# Patient Record
Sex: Female | Born: 1990 | Race: Black or African American | Hispanic: No | Marital: Married | Smoking: Current every day smoker
Health system: Southern US, Community
[De-identification: ages and names within clinical notes are randomized; demographics above are authoritative.]

## PROBLEM LIST (undated history)

## (undated) ENCOUNTER — Inpatient Hospital Stay (HOSPITAL_COMMUNITY): Payer: Self-pay

## (undated) DIAGNOSIS — M5430 Sciatica, unspecified side: Secondary | ICD-10-CM

## (undated) DIAGNOSIS — R87629 Unspecified abnormal cytological findings in specimens from vagina: Secondary | ICD-10-CM

## (undated) DIAGNOSIS — Z9889 Other specified postprocedural states: Secondary | ICD-10-CM

## (undated) HISTORY — PX: KNEE SURGERY: SHX244

---

## 2011-05-14 ENCOUNTER — Encounter (HOSPITAL_BASED_OUTPATIENT_CLINIC_OR_DEPARTMENT_OTHER): Payer: Self-pay | Admitting: *Deleted

## 2011-05-14 ENCOUNTER — Emergency Department (HOSPITAL_BASED_OUTPATIENT_CLINIC_OR_DEPARTMENT_OTHER)
Admission: EM | Admit: 2011-05-14 | Discharge: 2011-05-14 | Disposition: A | Discharge status: Home / Self Care | Payer: Self-pay | Attending: Emergency Medicine | Admitting: Emergency Medicine

## 2011-05-14 ENCOUNTER — Emergency Department (INDEPENDENT_AMBULATORY_CARE_PROVIDER_SITE_OTHER): Payer: Self-pay

## 2011-05-14 DIAGNOSIS — B9789 Other viral agents as the cause of diseases classified elsewhere: Secondary | ICD-10-CM | POA: Insufficient documentation

## 2011-05-14 DIAGNOSIS — R059 Cough, unspecified: Secondary | ICD-10-CM | POA: Insufficient documentation

## 2011-05-14 DIAGNOSIS — R0602 Shortness of breath: Secondary | ICD-10-CM | POA: Insufficient documentation

## 2011-05-14 DIAGNOSIS — R05 Cough: Secondary | ICD-10-CM | POA: Insufficient documentation

## 2011-05-14 DIAGNOSIS — B349 Viral infection, unspecified: Secondary | ICD-10-CM

## 2011-05-14 DIAGNOSIS — J45909 Unspecified asthma, uncomplicated: Secondary | ICD-10-CM | POA: Insufficient documentation

## 2011-05-14 DIAGNOSIS — R509 Fever, unspecified: Secondary | ICD-10-CM

## 2011-05-14 LAB — URINALYSIS, ROUTINE W REFLEX MICROSCOPIC
Ketones, ur: NEGATIVE mg/dL
Leukocytes, UA: NEGATIVE
Nitrite: NEGATIVE
Protein, ur: NEGATIVE mg/dL
Urobilinogen, UA: 1 mg/dL (ref 0.0–1.0)

## 2011-05-14 MED ORDER — ONDANSETRON HCL 4 MG PO TABS: 4.0000 mg | ORAL_TABLET | Freq: Four times a day (QID) | ORAL | Status: AC

## 2011-05-14 NOTE — ED Notes (Signed)
Pt states she has been vomiting today. Has no energy.

## 2011-05-14 NOTE — ED Notes (Signed)
Pt seen at [facility] yesterday for same, cough, nausea SOB, pt given presription for amoxil and zofran which she did not get filled

## 2011-05-14 NOTE — Discharge Instructions (Signed)
Viral Infections A viral infection can be caused by different types of viruses.Most viral infections are not serious and resolve on their own. However, some infections may cause severe symptoms and may lead to further complications. SYMPTOMS Viruses can frequently cause:  Minor sore throat.   Aches and pains.   Headaches.   Runny nose.   Different types of rashes.   Watery eyes.   Tiredness.   Cough.   Loss of appetite.   Gastrointestinal infections, resulting in nausea, vomiting, and diarrhea.  These symptoms do not respond to antibiotics because the infection is not caused by bacteria. However, you might catch a bacterial infection following the viral infection. This is sometimes called a "superinfection." Symptoms of such a bacterial infection may include:  Worsening sore throat with pus and difficulty swallowing.   Swollen neck glands.   Chills and a high or persistent fever.   Severe headache.   Tenderness over the sinuses.   Persistent overall ill feeling (malaise), muscle aches, and tiredness (fatigue).   Persistent cough.   Yellow, green, or brown mucus production with coughing.  HOME CARE INSTRUCTIONS   Only take over-the-counter or prescription medicines for pain, discomfort, diarrhea, or fever as directed by your caregiver.   Drink enough water and fluids to keep your urine clear or pale yellow. Sports drinks can provide valuable electrolytes, sugars, and hydration.   Get plenty of rest and maintain proper nutrition. Soups and broths with crackers or rice are fine.  SEEK IMMEDIATE MEDICAL CARE IF:   You have severe headaches, shortness of breath, chest pain, neck pain, or an unusual rash.   You have uncontrolled vomiting, diarrhea, or you are unable to keep down fluids.   You or your child has an oral temperature above 102 F (38.9 C), not controlled by medicine.   Your baby is older than 3 months with a rectal temperature of 102 F (38.9 C) or  higher.   Your baby is 3 months old or younger with a rectal temperature of 100.4 F (38 C) or higher.  MAKE SURE YOU:   Understand these instructions.   Will watch your condition.   Will get help right away if you are not doing well or get worse.  Document Released: 12/18/2004 Document Revised: 11/20/2010 Document Reviewed: 07/15/2010 ExitCare Patient Information 2012 [employer]. 

## 2011-05-14 NOTE — ED Notes (Signed)
Pt states can not afford meds

## 2011-05-14 NOTE — ED Provider Notes (Signed)
History     CSN: [id number]  Arrival date & time 05/14/11  4098   First MD Initiated Contact with Patient 05/14/11 1934      Chief Complaint  Patient presents with  . Shortness of Breath  . Cough    (Consider location/radiation/quality/duration/timing/severity/associated sxs/prior treatment) HPI Comments: Patient presents with 3 days of cough, congestion, nausea, shortness of breath, fever and vomiting. She seen at [facility] ER yesterday for the same and given Zithromax which she did not fill.  She states fevers to 102 with productive cough of yellow mucus. She's had frequent episodes of vomiting today. Denies any diarrhea, back pain, chest pain. No sick contacts. Did receive a flu shot.  Last menstrual period in January.  The history is provided by the patient.    Past Medical History  Diagnosis Date  . Asthma     History reviewed. No pertinent past surgical history.  History reviewed. No pertinent family history.  History  Substance Use Topics  . Smoking status: Never Smoker   . Smokeless tobacco: Not on file  . Alcohol Use: No    OB History    Grav Para Term Preterm Abortions TAB SAB Ect Mult Living                  Review of Systems  Constitutional: Negative for fever, activity change and appetite change.  HENT: Negative for congestion and rhinorrhea.   Eyes: Negative for photophobia.  Respiratory: Positive for cough and shortness of breath.   Cardiovascular: Negative for chest pain.  Gastrointestinal: Positive for nausea, vomiting and abdominal pain. Negative for diarrhea.  Genitourinary: Negative for dysuria, hematuria, vaginal bleeding and vaginal discharge.  Musculoskeletal: Positive for myalgias and arthralgias. Negative for back pain.  Neurological: Negative for weakness and headaches.    Allergies  Sulfa antibiotics  Home Medications   Current Outpatient Rx  Name Route Sig Dispense Refill  . ALBUTEROL SULFATE HFA 108 (90 BASE) MCG/ACT IN AERS  Inhalation Inhale into the lungs every 6 (six) hours as needed.    . ALBUTEROL SULFATE (2.5 MG/3ML) 0.083% IN NEBU Nebulization Take 2.5 mg by nebulization every 6 (six) hours as needed.    Marland Kitchen EXCEDRIN MIGRAINE PO Oral Take 2 tablets by mouth daily as needed. Patient used this medication for a migraine headache.    [place] IN Inhalation Inhale 2 puffs into the lungs daily as needed.    Marland Kitchen ONDANSETRON HCL 4 MG PO TABS Oral Take 1 tablet (4 mg total) by mouth every 6 (six) hours. 12 tablet 0    BP 130/80  Pulse 98  Temp(Src) 98.4 F (36.9 C) (Oral)  Resp 16  Ht 5\' 6"  (1.676 m)  Wt 187 lb (84.823 kg)  BMI 30.18 kg/m2  SpO2 100%  LMP 04/07/2011  Physical Exam  Constitutional: She is oriented to person, place, and time. She appears well-developed and well-nourished. No distress.       Playing on laptop computer  HENT:  Head: Normocephalic and atraumatic.  Right Ear: External ear normal.  Left Ear: External ear normal.  Mouth/Throat: Oropharynx is clear and moist. No oropharyngeal exudate.       No sinus tenderness  Eyes: Conjunctivae are normal. Pupils are equal, round, and reactive to light.  Neck: Normal range of motion.       No meningismus  Cardiovascular: Normal rate, regular rhythm and normal heart sounds.   Pulmonary/Chest: Effort normal and breath sounds normal.  Abdominal: Soft. There is no  tenderness. There is no rebound and no guarding.  Musculoskeletal: Normal range of motion. She exhibits no edema and no tenderness.  Neurological: She is alert and oriented to person, place, and time. No cranial nerve deficit.  Skin: Skin is warm.    ED Course  Procedures (including critical care time)   Labs Reviewed  URINALYSIS, ROUTINE W REFLEX MICROSCOPIC  PREGNANCY, URINE   Dg Chest 2 View  05/14/2011  *RADIOLOGY REPORT*  Clinical Data: Productive cough and fever  CHEST - 2 VIEW  Comparison: None.  Findings: Heart size is normal.  Mediastinal shadows are normal. Lungs are  clear.  No effusions.  No bony abnormalities.  IMPRESSION: Normal chest  Original Report Authenticated By: Thomasenia Sales, M.D.     1. Viral syndrome       MDM  Three days of a fever, nausea, vomiting, cough, congestion. No distress on exam, vitals stable.        Glynn Octave, MD 05/14/11 [phone]

## 2013-03-28 LAB — OB RESULTS CONSOLE GC/CHLAMYDIA
Chlamydia: NEGATIVE
Gonorrhea: NEGATIVE

## 2013-03-28 LAB — OB RESULTS CONSOLE HIV ANTIBODY (ROUTINE TESTING): HIV: NONREACTIVE

## 2013-03-28 LAB — OB RESULTS CONSOLE RUBELLA ANTIBODY, IGM: Rubella: IMMUNE

## 2013-03-28 LAB — OB RESULTS CONSOLE HGB/HCT, BLOOD
HCT: 32 %
HEMOGLOBIN: 11.3 g/dL

## 2013-03-28 LAB — OB RESULTS CONSOLE RPR: RPR: NONREACTIVE

## 2013-03-28 LAB — OB RESULTS CONSOLE PLATELET COUNT: PLATELETS: 271 10*3/uL

## 2013-03-28 LAB — OB RESULTS CONSOLE HEPATITIS B SURFACE ANTIGEN: Hepatitis B Surface Ag: NEGATIVE

## 2013-04-05 ENCOUNTER — Emergency Department (HOSPITAL_COMMUNITY)
Admission: EM | Admit: 2013-04-05 | Discharge: 2013-04-05 | Disposition: A | Discharge status: Home / Self Care | Payer: Medicaid Other | Attending: Emergency Medicine | Admitting: Emergency Medicine

## 2013-04-05 ENCOUNTER — Encounter (HOSPITAL_COMMUNITY): Payer: Self-pay | Admitting: Emergency Medicine

## 2013-04-05 DIAGNOSIS — J45909 Unspecified asthma, uncomplicated: Secondary | ICD-10-CM | POA: Insufficient documentation

## 2013-04-05 DIAGNOSIS — M5431 Sciatica, right side: Secondary | ICD-10-CM

## 2013-04-05 DIAGNOSIS — Z79899 Other long term (current) drug therapy: Secondary | ICD-10-CM | POA: Insufficient documentation

## 2013-04-05 DIAGNOSIS — M5430 Sciatica, unspecified side: Secondary | ICD-10-CM | POA: Insufficient documentation

## 2013-04-05 DIAGNOSIS — O9989 Other specified diseases and conditions complicating pregnancy, childbirth and the puerperium: Secondary | ICD-10-CM | POA: Insufficient documentation

## 2013-04-05 DIAGNOSIS — IMO0002 Reserved for concepts with insufficient information to code with codable children: Secondary | ICD-10-CM | POA: Insufficient documentation

## 2013-04-05 MED ORDER — HYDROCODONE-ACETAMINOPHEN 5-325 MG PO TABS
1.0000 | ORAL_TABLET | Freq: Four times a day (QID) | ORAL | Status: DC | PRN
Start: 2013-04-05 — End: 2013-06-27

## 2013-04-05 MED ORDER — HYDROCODONE-ACETAMINOPHEN 5-325 MG PO TABS
1.0000 | ORAL_TABLET | Freq: Once | ORAL | Status: AC
  Administered 2013-04-05: 1 via ORAL
  Filled 2013-04-05: qty 1

## 2013-04-05 NOTE — ED Notes (Addendum)
Per EMS: pt coming from home with c/o sever right leg pain. Pt reports waking up around midnight with pain starting at her right toe radiating up her back. Pt was ambulatory with assistance on scene. Pt has good PMS. Pt is [redacted] weeks pregnant. Pt denies injury/traum to leg. Pt is A&Ox, respirations equal and unlabored, skin warm and dry.

## 2013-04-05 NOTE — ED Provider Notes (Signed)
CSN: [id number]     Arrival date & time 04/05/13  0122 History   First MD Initiated Contact with Patient 04/05/13 0230     Chief Complaint  Patient presents with  . Leg Pain   (Consider location/radiation/quality/duration/timing/severity/associated sxs/prior Treatment) HPI This is a [age] year old female who is [redacted] weeks pregnant. She is here after awakening this morning with right lower back pain. The pain is located at about the right SI joint and radiates down the lateral aspect of the right leg then moving to the back of the leg past the knee. She denies any injury. She denies any numbness or weakness. She is not able to bear weight on that leg due to pain. She has had no acute bowel or bladder changes. [place] [facility].  Past Medical History  Diagnosis Date  . Asthma    History reviewed. No pertinent past surgical history. History reviewed. No pertinent family history. History  Substance Use Topics  . Smoking status: Never Smoker   . Smokeless tobacco: Not on file  . Alcohol Use: No   OB History   Grav Para Term Preterm Abortions TAB SAB Ect Mult Living   5 1             Review of Systems  All other systems reviewed and are negative.    Allergies  Sulfa antibiotics  Home Medications   Current Outpatient Rx  Name  Route  Sig  Dispense  Refill  . albuterol (PROVENTIL HFA;VENTOLIN HFA) 108 (90 BASE) MCG/ACT inhaler   Inhalation   Inhale into the lungs every 6 (six) hours as needed.         Marland Kitchen. albuterol (PROVENTIL) (2.5 MG/3ML) 0.083% nebulizer solution   Nebulization   Take 2.5 mg by nebulization every 6 (six) hours as needed.         . Aspirin-Acetaminophen-Caffeine (EXCEDRIN MIGRAINE PO)   Oral   Take 2 tablets by mouth daily as needed. Patient used this medication for a migraine headache.         . Budesonide-Formoterol Fumarate (SYMBICORT IN)   Inhalation   Inhale 2 puffs into the lungs daily as needed.          BP 110/78  Temp(Src)  98.1 F (36.7 C) (Oral)  Resp 18  SpO2 100%  Physical Exam General: Well-developed, well-nourished female in no acute distress; appearance consistent with age of record HENT: normocephalic; atraumatic Eyes: pupils equal, round and reactive to light; extraocular muscles intact Neck: supple Heart: regular rate and rhythm Lungs: clear to auscultation bilaterally Abdomen: soft; gravid; nontender; bowel sounds present Back: Right paraspinal tenderness; pain on right straight leg raise at about 30 Extremities: No deformity; decreased range of motion right hip due to pain; pulses normal Neurologic: Awake, alert and oriented; motor function intact in all extremities and symmetric; no facial droop; sensation intact and symmetric in lower extremities Skin: Warm and dry Psychiatric: Normal mood and affect    ED Course  Procedures (including critical care time)   MDM  Patient's exam is consistent with sciatica. She was advised of the desire to minimize medications in pregnancy will write for a short course of analgesics and have her followup with her obstetrician for longer term care.    Hanley SeamenJohn L Cathyrn Deas, MD 04/05/13 [phone]

## 2013-04-18 ENCOUNTER — Encounter (HOSPITAL_COMMUNITY): Payer: Self-pay | Admitting: *Deleted

## 2013-04-18 ENCOUNTER — Inpatient Hospital Stay (HOSPITAL_COMMUNITY)
Admission: AD | Admit: 2013-04-18 | Discharge: 2013-04-18 | Disposition: A | Discharge status: Home / Self Care | Payer: Medicaid Other | Source: Ambulatory Visit | Attending: Obstetrics and Gynecology | Admitting: Obstetrics and Gynecology

## 2013-04-18 DIAGNOSIS — O212 Late vomiting of pregnancy: Secondary | ICD-10-CM | POA: Insufficient documentation

## 2013-04-18 DIAGNOSIS — O219 Vomiting of pregnancy, unspecified: Secondary | ICD-10-CM

## 2013-04-18 DIAGNOSIS — O99891 Other specified diseases and conditions complicating pregnancy: Secondary | ICD-10-CM | POA: Insufficient documentation

## 2013-04-18 DIAGNOSIS — O99612 Diseases of the digestive system complicating pregnancy, second trimester: Secondary | ICD-10-CM

## 2013-04-18 DIAGNOSIS — K5900 Constipation, unspecified: Secondary | ICD-10-CM | POA: Insufficient documentation

## 2013-04-18 DIAGNOSIS — K219 Gastro-esophageal reflux disease without esophagitis: Secondary | ICD-10-CM | POA: Insufficient documentation

## 2013-04-18 DIAGNOSIS — O99330 Smoking (tobacco) complicating pregnancy, unspecified trimester: Secondary | ICD-10-CM | POA: Insufficient documentation

## 2013-04-18 DIAGNOSIS — O9989 Other specified diseases and conditions complicating pregnancy, childbirth and the puerperium: Secondary | ICD-10-CM

## 2013-04-18 HISTORY — DX: Sciatica, unspecified side: M54.30

## 2013-04-18 HISTORY — DX: Unspecified abnormal cytological findings in specimens from vagina: R87.629

## 2013-04-18 LAB — URINALYSIS, ROUTINE W REFLEX MICROSCOPIC
GLUCOSE, UA: NEGATIVE mg/dL
Hgb urine dipstick: NEGATIVE
Ketones, ur: 15 mg/dL — AB
LEUKOCYTES UA: NEGATIVE
NITRITE: NEGATIVE
PH: 8.5 — AB (ref 5.0–8.0)
Protein, ur: NEGATIVE mg/dL
Specific Gravity, Urine: 1.015 (ref 1.005–1.030)
Urobilinogen, UA: >8 mg/dL — ABNORMAL HIGH (ref 0.0–1.0)

## 2013-04-18 MED ORDER — POLYETHYLENE GLYCOL 3350 17 GM/SCOOP PO POWD: 17.0000 g | Freq: Two times a day (BID) | ORAL | Status: DC

## 2013-04-18 MED ORDER — PROMETHAZINE HCL 12.5 MG PO TABS: 12.5000 mg | ORAL_TABLET | Freq: Four times a day (QID) | ORAL | Status: DC | PRN

## 2013-04-18 MED ORDER — PROMETHAZINE HCL 25 MG PO TABS
25.0000 mg | ORAL_TABLET | Freq: Four times a day (QID) | ORAL | Status: DC | PRN
  Administered 2013-04-18: 25 mg via ORAL
  Filled 2013-04-18: qty 1

## 2013-04-18 NOTE — MAU Provider Note (Signed)
First Provider Initiated Contact with Patient 04/18/13 0222      Chief Complaint:  Nausea and Emesis During Pregnancy   Vicki Everett is  23 y.o. [id number] at [redacted]w[redacted]d presents complaining of Nausea and Emesis During Pregnancy Reports nausea and emesis starting yesterday at 3pm. Unable to keep anything down at home. Pt took zofran 4mg  x1 without resolution. No fevers/chills, no urinary symptoms, reports no BM in 2 weeks. No cough, sob, +HA, no CP, +Acid reflux. No sick contacts.   +FM, no LOF, no VB, no Ctx  Obstetrical/Gynecological History: OB History   Grav Para Term Preterm Abortions TAB SAB Ect Mult Living   5 1  1 3  3   1      Past Medical History: Past Medical History  Diagnosis Date  . Asthma   . Vaginal Pap smear, abnormal   . Sciatica     Past Surgical History: Past Surgical History  Procedure Laterality Date  . Knee surgery  2011 & 2012    Family History: History reviewed. No pertinent family history.  Social History: History  Substance Use Topics  . Smoking status: Current Every Day Smoker -- 0.25 packs/day    Types: Cigarettes  . Smokeless tobacco: Not on file  . Alcohol Use: No    Allergies:  Allergies  Allergen Reactions  . Sulfa Antibiotics Anaphylaxis and Itching    Meds:  Prescriptions prior to admission  Medication Sig Dispense Refill  . HYDROcodone-acetaminophen (NORCO/VICODIN) 5-325 MG per tablet Take 1-2 tablets by mouth every 6 (six) hours as needed (for pain).  20 tablet  0  . metroNIDAZOLE (METROGEL) 0.75 % vaginal gel Place 1 Applicatorful vaginally 2 (two) times daily.      . ondansetron (ZOFRAN-ODT) 4 MG disintegrating tablet Take 4 mg by mouth every 8 (eight) hours as needed for nausea or vomiting.      Marland Kitchen albuterol (PROVENTIL HFA;VENTOLIN HFA) 108 (90 BASE) MCG/ACT inhaler Inhale into the lungs every 6 (six) hours as needed.      Marland Kitchen albuterol (PROVENTIL) (2.5 MG/3ML) 0.083% nebulizer solution Take 2.5 mg by nebulization every 6 (six)  hours as needed.      . Aspirin-Acetaminophen-Caffeine (EXCEDRIN MIGRAINE PO) Take 2 tablets by mouth daily as needed. Patient used this medication for a migraine headache.      . Budesonide-Formoterol Fumarate (SYMBICORT IN) Inhale 2 puffs into the lungs daily as needed.        Physical Exam  Blood pressure 86/49, pulse 97, temperature 98.3 F (36.8 C), resp. rate 16, height 5\' 6"  (1.676 m), weight 77.565 kg (171 lb), SpO2 98.00%. GENERAL: Well-developed, well-nourished female in no acute distress.  LUNGS: Clear to auscultation bilaterally.  HEART: Regular rate and rhythm. ABDOMEN: Soft, nontender, nondistended, gravid.  EXTREMITIES: Nontender, no edema, 2+ distal pulses. DTR's 2+  Dilation: Closed Effacement (%): Thick Station: -3 Exam by:: Genea Rheaume MD  FHT:  Baseline rate 140s bpm   Variability moderate  Accelerations present 10x10  Decelerations variable once with good variability Contractions: none   Labs: Results for orders placed during the hospital encounter of 04/18/13 (from the past 24 hour(s))  URINALYSIS, ROUTINE W REFLEX MICROSCOPIC   Collection Time    04/18/13  1:31 AM      Result Value Range   Color, Urine YELLOW  YELLOW   APPearance CLEAR  CLEAR   Specific Gravity, Urine 1.015  1.005 - 1.030   pH 8.5 (*) 5.0 - 8.0   Glucose, UA NEGATIVE  NEGATIVE mg/dL  Hgb urine dipstick NEGATIVE  NEGATIVE   Bilirubin Urine SMALL (*) NEGATIVE   Ketones, ur 15 (*) NEGATIVE mg/dL   Protein, ur NEGATIVE  NEGATIVE mg/dL   Urobilinogen, UA >1.6>8.0 (*) 0.0 - 1.0 mg/dL   Nitrite NEGATIVE  NEGATIVE   Leukocytes, UA NEGATIVE  NEGATIVE   Imaging Studies:  No results found.  Assessment: Vicki Everett is  23 y.o. [id number] at 2368w2d presents with Nausea and vomiting. Next appt feb 3rd. Will give pt does of phenergan, reassuring fluid status based on exam, vitals and specific gravity. Improved nausea. Will discharge home with Rx for phenergan.  Constipation: likely related to PNV, zofran  use. Will start on miralax.  Vicki Everett 1/26/20152:22 AM

## 2013-04-18 NOTE — MAU Note (Signed)
Had nausea and vomiting for the last 9-10 hours. Took Zofran with no relief.

## 2013-04-18 NOTE — Discharge Instructions (Signed)
Nausea and Vomiting  Nausea is a sick feeling that often comes before throwing up (vomiting). Vomiting is a reflex where stomach contents come out of your mouth. Vomiting can cause severe loss of body fluids (dehydration). Children and elderly adults can become dehydrated quickly, especially if they also have diarrhea. Nausea and vomiting are symptoms of a condition or disease. It is important to find the cause of your symptoms.  CAUSES    Direct irritation of the stomach lining. This irritation can result from increased acid production (gastroesophageal reflux disease), infection, food poisoning, taking certain medicines (such as nonsteroidal anti-inflammatory drugs), alcohol use, or tobacco use.   Signals from the brain.These signals could be caused by a headache, heat exposure, an inner ear disturbance, increased pressure in the brain from injury, infection, a tumor, or a concussion, pain, emotional stimulus, or metabolic problems.   An obstruction in the gastrointestinal tract (bowel obstruction).   Illnesses such as diabetes, hepatitis, gallbladder problems, appendicitis, kidney problems, cancer, sepsis, atypical symptoms of a heart attack, or eating disorders.   Medical treatments such as chemotherapy and radiation.   Receiving medicine that makes you sleep (general anesthetic) during surgery.  DIAGNOSIS  Your caregiver may ask for tests to be done if the problems do not improve after a few days. Tests may also be done if symptoms are severe or if the reason for the nausea and vomiting is not clear. Tests may include:   Urine tests.   Blood tests.   Stool tests.   Cultures (to look for evidence of infection).   X-rays or other imaging studies.  Test results can help your caregiver make decisions about treatment or the need for additional tests.  TREATMENT  You need to stay well hydrated. Drink frequently but in small amounts.You may wish to drink water, sports drinks, clear broth, or eat frozen  ice pops or gelatin dessert to help stay hydrated.When you eat, eating slowly may help prevent nausea.There are also some antinausea medicines that may help prevent nausea.  HOME CARE INSTRUCTIONS    Take all medicine as directed by your caregiver.   If you do not have an appetite, do not force yourself to eat. However, you must continue to drink fluids.   If you have an appetite, eat a normal diet unless your caregiver tells you differently.   Eat a variety of complex carbohydrates (rice, wheat, potatoes, bread), lean meats, yogurt, fruits, and vegetables.   Avoid high-fat foods because they are more difficult to digest.   Drink enough water and fluids to keep your urine clear or pale yellow.   If you are dehydrated, ask your caregiver for specific rehydration instructions. Signs of dehydration may include:   Severe thirst.   Dry lips and mouth.   Dizziness.   Dark urine.   Decreasing urine frequency and amount.   Confusion.   Rapid breathing or pulse.  SEEK IMMEDIATE MEDICAL CARE IF:    You have blood or brown flecks (like coffee grounds) in your vomit.   You have black or bloody stools.   You have a severe headache or stiff neck.   You are confused.   You have severe abdominal pain.   You have chest pain or trouble breathing.   You do not urinate at least once every 8 hours.   You develop cold or clammy skin.   You continue to vomit for longer than 24 to 48 hours.   You have a fever.  MAKE SURE YOU:      Understand these instructions.   Will watch your condition.   Will get help right away if you are not doing well or get worse.  Document Released: 03/10/2005 Document Revised: 06/02/2011 Document Reviewed: 08/07/2010  [employer] Patient Information 2014 [employer].          Constipation, Adult  Constipation is when a person has fewer than 3 bowel movements a week; has difficulty having a bowel movement; or has stools that are dry, hard, or larger than normal. As people grow older,  constipation is more common. If you try to fix constipation with medicines that make you have a bowel movement (laxatives), the problem may get worse. Long-term laxative use may cause the muscles of the colon to become weak. A low-fiber diet, not taking in enough fluids, and taking certain medicines may make constipation worse.  CAUSES    Certain medicines, such as antidepressants, pain medicine, iron supplements, antacids, and water pills.    Certain diseases, such as diabetes, irritable bowel syndrome (IBS), thyroid disease, or depression.    Not drinking enough water.    Not eating enough fiber-rich foods.    Stress or travel.   Lack of physical activity or exercise.   Not going to the restroom when there is the urge to have a bowel movement.   Ignoring the urge to have a bowel movement.   Using laxatives too much.  SYMPTOMS    Having fewer than 3 bowel movements a week.    Straining to have a bowel movement.    Having hard, dry, or larger than normal stools.    Feeling full or bloated.    Pain in the lower abdomen.   Not feeling relief after having a bowel movement.  DIAGNOSIS   Your caregiver will take a medical history and perform a physical exam. Further testing may be done for severe constipation. Some tests may include:    A barium enema X-ray to examine your rectum, colon, and sometimes, your small intestine.   A sigmoidoscopy to examine your lower colon.   A colonoscopy to examine your entire colon.  TREATMENT   Treatment will depend on the severity of your constipation and what is causing it. Some dietary treatments include drinking more fluids and eating more fiber-rich foods. Lifestyle treatments may include regular exercise. If these diet and lifestyle recommendations do not help, your caregiver may recommend taking over-the-counter laxative medicines to help you have bowel movements. Prescription medicines may be prescribed if over-the-counter medicines do not work.   HOME  CARE INSTRUCTIONS    Increase dietary fiber in your diet, such as fruits, vegetables, whole grains, and beans. Limit high-fat and processed sugars in your diet, such as French fries, hamburgers, cookies, candies, and soda.    A fiber supplement may be added to your diet if you cannot get enough fiber from foods.    Drink enough fluids to keep your urine clear or pale yellow.    Exercise regularly or as directed by your caregiver.    Go to the restroom when you have the urge to go. Do not hold it.   Only take medicines as directed by your caregiver. Do not take other medicines for constipation without talking to your caregiver first.  SEEK IMMEDIATE MEDICAL CARE IF:    You have bright red blood in your stool.    Your constipation lasts for more than 4 days or gets worse.    You have abdominal or rectal pain.    You   have thin, pencil-like stools.   You have unexplained weight loss.  MAKE SURE YOU:    Understand these instructions.   Will watch your condition.   Will get help right away if you are not doing well or get worse.  Document Released: 12/07/2003 Document Revised: 06/02/2011 Document Reviewed: 12/20/2012  [employer] Patient Information 2014 [employer].

## 2013-04-20 NOTE — MAU Provider Note (Signed)
`````  Attestation of Attending Supervision of Advanced Practitioner: Evaluation and management procedures were performed by the PA/NP/CNM/OB Fellow under my supervision/collaboration. Chart reviewed and agree with management and plan.  Korah Hufstedler V 04/20/2013 5:33 PM     

## 2013-04-23 ENCOUNTER — Encounter (HOSPITAL_COMMUNITY): Payer: Self-pay | Admitting: *Deleted

## 2013-04-23 ENCOUNTER — Inpatient Hospital Stay (HOSPITAL_COMMUNITY)
Admission: AD | Admit: 2013-04-23 | Discharge: 2013-04-23 | Disposition: A | Discharge status: Home / Self Care | Payer: Medicaid Other | Attending: Obstetrics and Gynecology | Admitting: Obstetrics and Gynecology

## 2013-04-23 ENCOUNTER — Inpatient Hospital Stay (HOSPITAL_COMMUNITY): Payer: Medicaid Other

## 2013-04-23 DIAGNOSIS — W010XXA Fall on same level from slipping, tripping and stumbling without subsequent striking against object, initial encounter: Secondary | ICD-10-CM | POA: Insufficient documentation

## 2013-04-23 DIAGNOSIS — Z3480 Encounter for supervision of other normal pregnancy, unspecified trimester: Secondary | ICD-10-CM

## 2013-04-23 DIAGNOSIS — O9989 Other specified diseases and conditions complicating pregnancy, childbirth and the puerperium: Principal | ICD-10-CM

## 2013-04-23 DIAGNOSIS — Y92009 Unspecified place in unspecified non-institutional (private) residence as the place of occurrence of the external cause: Secondary | ICD-10-CM | POA: Insufficient documentation

## 2013-04-23 DIAGNOSIS — W19XXXA Unspecified fall, initial encounter: Secondary | ICD-10-CM

## 2013-04-23 DIAGNOSIS — O99891 Other specified diseases and conditions complicating pregnancy: Secondary | ICD-10-CM | POA: Insufficient documentation

## 2013-04-23 DIAGNOSIS — Z87891 Personal history of nicotine dependence: Secondary | ICD-10-CM | POA: Insufficient documentation

## 2013-04-23 LAB — URINALYSIS, ROUTINE W REFLEX MICROSCOPIC
BILIRUBIN URINE: NEGATIVE
Glucose, UA: NEGATIVE mg/dL
Hgb urine dipstick: NEGATIVE
KETONES UR: NEGATIVE mg/dL
LEUKOCYTES UA: NEGATIVE
NITRITE: NEGATIVE
PH: 6 (ref 5.0–8.0)
Protein, ur: NEGATIVE mg/dL
SPECIFIC GRAVITY, URINE: 1.025 (ref 1.005–1.030)
UROBILINOGEN UA: 1 mg/dL (ref 0.0–1.0)

## 2013-04-23 LAB — RAPID URINE DRUG SCREEN, HOSP PERFORMED
Amphetamines: NOT DETECTED
BARBITURATES: NOT DETECTED
BENZODIAZEPINES: NOT DETECTED
COCAINE: NOT DETECTED
Opiates: POSITIVE — AB
Tetrahydrocannabinol: NOT DETECTED

## 2013-04-23 NOTE — MAU Note (Signed)
Pt up to bathroom FHR down to 100 bpm while getting up to go to the bathroom, so pt placed back on EFM for monitoring

## 2013-04-23 NOTE — MAU Note (Signed)
Pt presents with complaints of falling over a rug this morning around 2am but doesn't remember how she landed. States she hasn't felt the baby move since she fell.

## 2013-04-23 NOTE — MAU Provider Note (Signed)
Attestation of Attending Supervision of Advanced Practitioner (CNM/NP): Evaluation and management procedures were performed by the Advanced Practitioner under my supervision and collaboration.  I have reviewed the Advanced Practitioner's note and chart, and I agree with the management and plan.  Kyndal Heringer 04/23/2013 6:57 PM   

## 2013-04-23 NOTE — MAU Provider Note (Signed)
History     CSN: [id number]  Arrival date and time: 04/23/13 0957   None     Chief Complaint  Patient presents with  . Fall   HPI Malachi ParadiseCarmella Noland FordyceLentz is a 23 y.o. [id number] female @ 345w0d who presents w/ report of tripping over welcome mat on porch @ 0200 this am as she returned from church. States they are having a week-long program and sometimes it runs late at night. Reports falling on knees and onto Lt side. Doesn't think abdomen hit porch, but can't remember. Reported to nurses on arrival that she hadn't felt baby move since, however she reported to me that the baby has been moving like she normally does the entire time since she fell. Denies uc's, vb, lof, illicit drug or etoh use. Ranelle Oystereceives pnc at [facility], Dr. Janae SauceLouk.  Denies any complications during this pregnancy. Next appt Tues. Just recently moved to [place], [facility], hasn't found provider in [place] yet.   OB History   Grav Para Term Preterm Abortions TAB SAB Ect Mult Living   5 1  1 3  3   1       Past Medical History  Diagnosis Date  . Asthma   . Vaginal Pap smear, abnormal   . Sciatica     Past Surgical History  Procedure Laterality Date  . Knee surgery  2011 & 2012    History reviewed. No pertinent family history.  History  Substance Use Topics  . Smoking status: Former Smoker -- 0.25 packs/day  . Smokeless tobacco: Not on file  . Alcohol Use: No    Allergies:  Allergies  Allergen Reactions  . Sulfa Antibiotics Anaphylaxis and Itching    Prescriptions prior to admission  Medication Sig Dispense Refill  . acetaminophen (TYLENOL) 500 MG tablet Take 500 mg by mouth daily as needed.      Marland Kitchen. HYDROcodone-acetaminophen (NORCO/VICODIN) 5-325 MG per tablet Take 1-2 tablets by mouth every 6 (six) hours as needed (for pain).  20 tablet  0  . ondansetron (ZOFRAN-ODT) 4 MG disintegrating tablet Take 4 mg by mouth every 8 (eight) hours as needed for nausea or vomiting.      . promethazine (PHENERGAN)  12.5 MG tablet Take 1 tablet (12.5 mg total) by mouth every 6 (six) hours as needed for nausea or vomiting.  15 tablet  0  . albuterol (PROVENTIL HFA;VENTOLIN HFA) 108 (90 BASE) MCG/ACT inhaler Inhale into the lungs every 6 (six) hours as needed.      Marland Kitchen. albuterol (PROVENTIL) (2.5 MG/3ML) 0.083% nebulizer solution Take 2.5 mg by nebulization every 6 (six) hours as needed.      . polyethylene glycol powder (MIRALAX) powder Take 17 g by mouth 2 (two) times daily.  255 g  1  . [DISCONTINUED] metroNIDAZOLE (METROGEL) 0.75 % vaginal gel Place 1 Applicatorful vaginally 2 (two) times daily.        ROS Pertinent +/- as listed in HPI Physical Exam   Blood pressure 125/69, pulse 107, temperature 97.5 F (36.4 C), resp. rate 16.  Physical Exam  Constitutional: She is oriented to person, place, and time. She appears well-developed and well-nourished.  HENT:  Head: Normocephalic.  Cardiovascular: Normal rate.   Respiratory: Effort normal.  GI: Soft. There is no tenderness.  gravid  Neurological: She is alert and oriented to person, place, and time.  Skin: Skin is warm and dry.  Psychiatric: She has a normal mood and affect. Her behavior is normal. Judgment  and thought content normal.   FHR: 140, mod variability, +accels, no decels UCs: none MAU Course  Procedures  EFM x 4hrs, pt sleeping majority of time Exam UDS, pos opiates- pt has rx for hydrocodone Request records from [facility]  Results for orders placed during the hospital encounter of 04/23/13 (from the past 24 hour(s))  URINALYSIS, ROUTINE W REFLEX MICROSCOPIC     Status: None   Collection Time    04/23/13 10:20 AM      Result Value Range   Color, Urine YELLOW  YELLOW   APPearance CLEAR  CLEAR   Specific Gravity, Urine 1.025  1.005 - 1.030   pH 6.0  5.0 - 8.0   Glucose, UA NEGATIVE  NEGATIVE mg/dL   Hgb urine dipstick NEGATIVE  NEGATIVE   Bilirubin Urine NEGATIVE  NEGATIVE   Ketones, ur NEGATIVE  NEGATIVE mg/dL   Protein, ur  NEGATIVE  NEGATIVE mg/dL   Urobilinogen, UA 1.0  0.0 - 1.0 mg/dL   Nitrite NEGATIVE  NEGATIVE   Leukocytes, UA NEGATIVE  NEGATIVE  URINE RAPID DRUG SCREEN (HOSP PERFORMED)     Status: Abnormal   Collection Time    04/23/13 10:20 AM      Result Value Range   Opiates POSITIVE (*) NONE DETECTED   Cocaine NONE DETECTED  NONE DETECTED   Benzodiazepines NONE DETECTED  NONE DETECTED   Amphetamines NONE DETECTED  NONE DETECTED   Tetrahydrocannabinol NONE DETECTED  NONE DETECTED   Barbiturates NONE DETECTED  NONE DETECTED    Assessment and Plan  A:   [redacted]w[redacted]d SIUP  G5P0131   S/p fall  Cat I FHR, reassuring for GA x 4hrs  No uterine activity x 4hrs P:  D/C home  Printed info on s/s abruption, reasons to return to care immediately given  To keep appt w/ Dr. Janae Sauce in [place] on Tues  List of [place] [facility] providers given, as pt recently moved to [place], plans on delivering at [facility], just hasn't found [place] provider yet  To make appt w/ [place] provider of her choice asap   Marge Duncans 04/23/2013, 10:32 AM    1357: As being taken off of efm, fhr down to 100s from 140, so asked RN to place pt back on to confirm reassuring fhr 1422: Notified by RN, pt now contracting- pt perceives as slight crampiness 1430: discussed w/ Dr. Jolayne Panther, push po hydration, will get u/s to assess for abruption. If u/s normal and uc's resolve w/ po hydration, OK to continue w/ d/c home  1530: Returned from u/s, uc's had resolved prior to going to u/s.  U/s report: frank breech, fhr 156, placenta anterior above os, no definite evidence of acute placental abruption, AFI subj wnl, largest pocket 5.6cm, CL=3.9cm SVE: LTC  Pt to be d/c'd home, thoroughly reviewed reasons to return  Cheral Marker, CNM, [facility] 04/23/2013 3:32 PM

## 2013-04-23 NOTE — Discharge Instructions (Signed)
Prenatal Care [facility]    [facility]  [phone]     Phone: [phone]          [facility]                      [facility]  @[place]     Phone: [phone]  Phone: [phone]         [facility]     Phone: [phone]  Phone: [phone]           [facility] [place]                hone: [phone]  Phone: [phone]         [facility] [address] Francoise Ceo      Phone: [phone]  Phone: [phone]         [facility]                Phone: [phone]  [facility]   [address] [place])          Phone: [phone] [facility]                        [facility]   Phone: [phone]                                                       Phone: [phone]  After monitoring you for 4 hours, you have not had any uterine activity on the monitor and your baby's heart rate looks reassuring for this point in pregnancy. Sometimes after falling during pregnancy you can develop what's called a placental abruption, you are not showing any signs of this right now. Please be on the lookout for these symptoms, and if you develop any of them, please seek care immediately.   Placental Abruption Placental abruption is when the placenta partially or completely separates from the uterus before the baby (fetus) is born. The placenta is the organ that provides nourishment to the baby. Normally, the placenta does not detach from the womb until after the baby is born. When it is large and separates before the baby is born, it may be a threat to the baby and mother's life. A small abruption may not be noticed until after the birth. Placenta abruption is uncommon. CAUSES  Often times, your caregiver will not know the cause. However, some  uncommon causes include:   Abdominal injury.  Turning a baby that is presenting their buttocks first (breech) or is lying sideways in the uterus (transverse) to a headfirst position (external cephalic version).  Delivering the first twin.  Sudden loss of amniotic fluid (premature rupture of the membranes).  Abnormally short umbilical cord. SYMPTOMS  When the placental separation is small, it may not produce symptoms. There may be a small amount of belly (abdominal) pain or slight amount of vaginal bleeding.  Symptoms of severe problems will depend on the size of the separation and the stage of pregnancy. Symptoms may include:   Vaginal bleeding.  Uterine tenderness.  Fetal distress detected by fetal monitoring.  Severe abdominal  pain with tenderness.  Continual uterine contraction (tetany).  Back pain.  Maternal shock with severe hemorrhage. RISK FACTORS  History of abruption.  High blood pressure.  Smoking and alcohol intake.  Blood clotting problems.  Too much fluid in the baby's sac (polyhydramnios).  Twins or more.  High blood pressure during pregnancy (preeclampsia) or seizures and convulsions (eclampsia).  Diabetes.  Having had more than four children.  Pregnancy in older women (35 years or older).  Illegal drugs.  Injury or trauma to the abdomen. PREVENTION  Prevention begins with good prenatal care:  Stop using alcohol, illegal drugs and smoking.  Obey traffic laws and practice defensive driving.  Avoid dangerous activities such as snow and water skiing, horseback riding, motorcycles and mountain climbing.  Wear seat belts properly and at all times.  Control high blood pressure and diabetes.  Avoid situations where there is domestic violence. DIAGNOSIS  Placental abruption is suspected when a pregnant woman develops sudden uterine pain with or without bleeding. The uterus usually is very tender and hard. It may be enlarging because of the  bleeding and the fetus may show signs of distress. Distress may show up as an abnormal heart rate or rhythm. When your caregiver sees these signs, they may do an ultrasound test to look for a clot behind the placenta. They will also do blood work to make sure there are not clotting problems, signs of too much blood loss, or not enough healthy red blood cells (anemia). These all require a blood transfusion. TREATMENT  Treatment depends on many things such as:   The amount of bleeding.  Distress with the baby or mother.  How far along the pregnancy is.  The maturity of the baby. This condition is usually an emergency. When the mother or fetus is in distress, it requires treatment right away to protect the safety of the mother and infant. If the baby is mature and delivery time is near:   Careful observation may allow the baby to be delivered vaginally. A vaginal birth is usually preferred over caesarean section unless there is fetal distress.  Sometimes, a caesarean section cannot be done if there are clotting problems or a DIC. If the symptoms are severe and delivery is not about to happen:   A cesarean section may be done. This is an operation on the abdomen to remove the baby. If the symptoms are mild and there are no signs of distress with the baby or mother:   You may have to stay in the hospital for a couple of days for observation.  You may be given steroid medication to get the baby's lungs mature when necessary.  If you are Rh negative and the father is Rh positive, you may get Rho-gam to prevent Rh problems in the baby.  When everything is ok and safe, you may go home and be placed on bed rest. HOME CARE INSTRUCTIONS   Take all medications as directed by your caregiver.  Keep all your follow-up prenatal visits.  Arrange for help at home before and after you deliver the baby, especially if you had a Cesarean section or a large amount of bleeding.  Get plenty of rest and  sleep, especially after the baby is born.  Eat a nutritious and balanced diet.  Do not have sexual intercourse, use tampons or douche with out your caregiver's permission. SEEK IMMEDIATE MEDICAL CARE IF: Before delivery:  Any type of vaginal bleeding.  Abdominal pain  Continuous uterine contractions.  A  hard, tender uterus.  You do not feel the baby move or the baby has very little movement. After delivery:  Started to pass large clots or pieces of tissue. This may be small pieces of placenta left following delivery.  Noticed that you are soaking more than one sanitary pad per hour, for several hours.  Heavy, bright-red bleeding which occurs four days or more after delivery.  A vaginal discharge which has a bad smell.  An unexplained oral temperature above 100 F (37.8 C).  Episodes of lightheadedness or fainting.  Shortness of breath or a rapid heartbeat with very little activity (exertion).  Abdominal pain.  Leg or chest pain. If you are having any of these symptoms, call your caregiver right away. Document Released: 03/10/2005 Document Revised: 06/02/2011 Document Reviewed: 06/29/2008 [facility] Patient Information 2014 [place], [place].

## 2013-04-24 DIAGNOSIS — Z9889 Other specified postprocedural states: Secondary | ICD-10-CM

## 2013-04-24 HISTORY — DX: Other specified postprocedural states: Z98.890

## 2013-05-05 ENCOUNTER — Inpatient Hospital Stay (HOSPITAL_COMMUNITY)
Admission: AD | Admit: 2013-05-05 | Discharge: 2013-05-06 | Disposition: A | Discharge status: Home / Self Care | Payer: Medicaid Other | Source: Ambulatory Visit | Attending: Family Medicine | Admitting: Family Medicine

## 2013-05-05 ENCOUNTER — Encounter (HOSPITAL_COMMUNITY): Payer: Self-pay

## 2013-05-05 DIAGNOSIS — J45909 Unspecified asthma, uncomplicated: Secondary | ICD-10-CM | POA: Insufficient documentation

## 2013-05-05 DIAGNOSIS — O479 False labor, unspecified: Secondary | ICD-10-CM

## 2013-05-05 DIAGNOSIS — Z87891 Personal history of nicotine dependence: Secondary | ICD-10-CM | POA: Insufficient documentation

## 2013-05-05 DIAGNOSIS — O4700 False labor before 37 completed weeks of gestation, unspecified trimester: Secondary | ICD-10-CM

## 2013-05-05 LAB — URINALYSIS, ROUTINE W REFLEX MICROSCOPIC
Bilirubin Urine: NEGATIVE
GLUCOSE, UA: NEGATIVE mg/dL
Hgb urine dipstick: NEGATIVE
KETONES UR: 15 mg/dL — AB
LEUKOCYTES UA: NEGATIVE
NITRITE: NEGATIVE
PH: 5.5 (ref 5.0–8.0)
Protein, ur: NEGATIVE mg/dL
Urobilinogen, UA: 1 mg/dL (ref 0.0–1.0)

## 2013-05-05 MED ORDER — LACTATED RINGERS IV BOLUS (SEPSIS)
1000.0000 mL | Freq: Once | INTRAVENOUS | Status: AC
  Administered 2013-05-05: 1000 mL via INTRAVENOUS

## 2013-05-05 NOTE — Discharge Instructions (Signed)

## 2013-05-05 NOTE — MAU Note (Signed)
Contractions every 4 minutes x 2 days. Denies LOF or VB. Positive fetal movement, decreased today.

## 2013-05-05 NOTE — MAU Provider Note (Signed)
History     CSN: [id number]  Arrival date and time: 05/05/13 2026   First Provider [id number] Contact with Patient 05/05/13 2059      Chief Complaint  Patient presents with  . Contractions   HPI Ms. Vicki Everett is a 23 y.o. [id number] at 1235w5d with a history of PTD with previous pregnancy who presents to MAU today with complaint of contractions x 2 days. She states that they were mild at first and q 10-15 minutes. Today they have progressed to be more frequent and more painful. She rates her pain at 9/10 with contractions. She denies LOF, vaginal bleeding, UTI symptoms or fever. She has had nausea without vomiting.   OB History   Grav Para Term Preterm Abortions TAB SAB Ect Mult Living   5 1  1 3  3   1       Past Medical History  Diagnosis Date  . Asthma   . Vaginal Pap smear, abnormal   . Sciatica     Past Surgical History  Procedure Laterality Date  . Knee surgery  2011 & 2012    History reviewed. No pertinent family history.  History  Substance Use Topics  . Smoking status: Former Smoker -- 0.25 packs/day  . Smokeless tobacco: Not on file  . Alcohol Use: No    Allergies:  Allergies  Allergen Reactions  . Sulfa Antibiotics Anaphylaxis and Itching  . Sodium Chloride Hives and Itching    IV sodium chloride caused hives and itching.    Prescriptions prior to admission  Medication Sig Dispense Refill  . acetaminophen (TYLENOL) 500 MG tablet Take 1,000 mg by mouth daily as needed for mild pain.       Marland Kitchen. HYDROcodone-acetaminophen (NORCO/VICODIN) 5-325 MG per tablet Take 1-2 tablets by mouth every 6 (six) hours as needed (for pain).  20 tablet  0  . ondansetron (ZOFRAN-ODT) 4 MG disintegrating tablet Take 4 mg by mouth every 8 (eight) hours as needed for nausea or vomiting.      . polyethylene glycol powder (MIRALAX) powder Take 17 g by mouth 2 (two) times daily.  255 g  1  . promethazine (PHENERGAN) 12.5 MG tablet Take 1 tablet (12.5 mg total) by mouth every 6  (six) hours as needed for nausea or vomiting.  15 tablet  0  . albuterol (PROVENTIL HFA;VENTOLIN HFA) 108 (90 BASE) MCG/ACT inhaler Inhale 2 puffs into the lungs every 6 (six) hours as needed for wheezing or shortness of breath.         Review of Systems  Constitutional: Negative for fever and malaise/fatigue.  Gastrointestinal: Positive for nausea and abdominal pain. Negative for vomiting, diarrhea and constipation.  Genitourinary: Negative for dysuria, urgency and frequency.       Neg - vaginal bleeding, discharge, LOF   Physical Exam   Blood pressure 130/80, pulse 104, temperature 98.3 F (36.8 C), temperature source Oral, resp. rate 18, height 5\' 6"  (1.676 m), weight 171 lb (77.565 kg).  Physical Exam  Constitutional: She is oriented to person, place, and time. She appears well-developed and well-nourished. No distress.  HENT:  Head: Normocephalic and atraumatic.  Cardiovascular: Tachycardia present.   Respiratory: Effort normal.  GI: Soft. She exhibits no distension and no mass. There is no tenderness. There is no rebound and no guarding.  Neurological: She is alert and oriented to person, place, and time.  Skin: Skin is warm and dry. No erythema.  Psychiatric: She has a normal mood and affect.  Dilation:  Fingertip Effacement (%): Thick Cervical Position: Posterior Exam by:: [facility]  Results for orders placed during the hospital encounter of 05/05/13 (from the past 24 hour(s))  URINALYSIS, ROUTINE W REFLEX MICROSCOPIC     Status: Abnormal   Collection Time    05/05/13  8:32 PM      Result Value Ref Range   Color, Urine YELLOW  YELLOW   APPearance HAZY (*) CLEAR   Specific Gravity, Urine >1.030 (*) 1.005 - 1.030   pH 5.5  5.0 - 8.0   Glucose, UA NEGATIVE  NEGATIVE mg/dL   Hgb urine dipstick NEGATIVE  NEGATIVE   Bilirubin Urine NEGATIVE  NEGATIVE   Ketones, ur 15 (*) NEGATIVE mg/dL   Protein, ur NEGATIVE  NEGATIVE mg/dL   Urobilinogen, UA 1.0  0.0 - 1.0 mg/dL    Nitrite NEGATIVE  NEGATIVE   Leukocytes, UA NEGATIVE  NEGATIVE   Fetal Monitoring: Baseline: 140 bpm, moderate variability, + accelerations, no decelerations Contractions: moderate UI with irregular contractions q 5-8 minutes  MAU Course  Procedures None  MDM UA shows signs of dehydration.  1 L IV LR given Discussed patient with Dr. Shawnie Pons. Check cervix. If unchanged patient is ok for discharge with precautions Cervix unchanged  Patient states that she is unsure if she will stay in [place] or move back to [place]. She states that she has continued care with [place] [facility] currently. Assessment and Plan  A: Preterm contractions  P: Discharge home Preterm labor precautions discussed Patient advised follow-up with [place] [facility] as scheduled for routine prenatal care. Patient given contact information for [facility] clinic if needed for prenatal care in [facility] Patient may return to MAU as needed or if her condition were to change or worsen  Freddi Starr, PA-C  05/05/2013, 8:59 PM

## 2013-05-06 NOTE — MAU Provider Note (Signed)
Attestation of Attending Supervision of Advanced Practitioner (PA/CNM/NP): Evaluation and management procedures were performed by the Advanced Practitioner under my supervision and collaboration.  I have reviewed the Advanced Practitioner's note and chart, and I agree with the management and plan.  Shacoria Latif S, MD [facility] Attending 05/06/2013 7:11 AM   

## 2013-05-10 ENCOUNTER — Encounter (HOSPITAL_COMMUNITY): Payer: Self-pay | Admitting: Emergency Medicine

## 2013-05-10 ENCOUNTER — Emergency Department (HOSPITAL_COMMUNITY): Payer: Medicaid Other

## 2013-05-10 ENCOUNTER — Inpatient Hospital Stay (HOSPITAL_COMMUNITY)
Admission: EM | Admit: 2013-05-10 | Discharge: 2013-05-12 | DRG: 778 | Disposition: A | Discharge status: Home / Self Care | Payer: Medicaid Other | Attending: Obstetrics & Gynecology | Admitting: Obstetrics & Gynecology

## 2013-05-10 DIAGNOSIS — N858 Other specified noninflammatory disorders of uterus: Secondary | ICD-10-CM

## 2013-05-10 DIAGNOSIS — O4700 False labor before 37 completed weeks of gestation, unspecified trimester: Principal | ICD-10-CM | POA: Diagnosis present

## 2013-05-10 DIAGNOSIS — J45909 Unspecified asthma, uncomplicated: Secondary | ICD-10-CM

## 2013-05-10 DIAGNOSIS — O99891 Other specified diseases and conditions complicating pregnancy: Secondary | ICD-10-CM

## 2013-05-10 DIAGNOSIS — Y9229 Other specified public building as the place of occurrence of the external cause: Secondary | ICD-10-CM

## 2013-05-10 DIAGNOSIS — O9989 Other specified diseases and conditions complicating pregnancy, childbirth and the puerperium: Secondary | ICD-10-CM

## 2013-05-10 DIAGNOSIS — N859 Noninflammatory disorder of uterus, unspecified: Secondary | ICD-10-CM

## 2013-05-10 DIAGNOSIS — M25559 Pain in unspecified hip: Secondary | ICD-10-CM

## 2013-05-10 DIAGNOSIS — M25552 Pain in left hip: Secondary | ICD-10-CM

## 2013-05-10 DIAGNOSIS — Z87891 Personal history of nicotine dependence: Secondary | ICD-10-CM

## 2013-05-10 DIAGNOSIS — W19XXXA Unspecified fall, initial encounter: Secondary | ICD-10-CM

## 2013-05-10 DIAGNOSIS — W010XXA Fall on same level from slipping, tripping and stumbling without subsequent striking against object, initial encounter: Secondary | ICD-10-CM | POA: Diagnosis present

## 2013-05-10 LAB — CBC
HCT: 30.3 % — ABNORMAL LOW (ref 36.0–46.0)
Hemoglobin: 10.4 g/dL — ABNORMAL LOW (ref 12.0–15.0)
MCH: 28.4 pg (ref 26.0–34.0)
MCHC: 34.3 g/dL (ref 30.0–36.0)
MCV: 82.8 fL (ref 78.0–100.0)
Platelets: 172 10*3/uL (ref 150–400)
RBC: 3.66 MIL/uL — AB (ref 3.87–5.11)
RDW: 12.6 % (ref 11.5–15.5)
WBC: 10.2 10*3/uL (ref 4.0–10.5)

## 2013-05-10 LAB — FETAL FIBRONECTIN: Fetal Fibronectin: NEGATIVE

## 2013-05-10 LAB — OB RESULTS CONSOLE GBS: GBS: NEGATIVE

## 2013-05-10 MED ORDER — DOCUSATE SODIUM 100 MG PO CAPS
100.0000 mg | ORAL_CAPSULE | Freq: Every day | ORAL | Status: DC
  Administered 2013-05-11 – 2013-05-12 (×2): 100 mg via ORAL
  Filled 2013-05-10 (×2): qty 1

## 2013-05-10 MED ORDER — ZOLPIDEM TARTRATE 5 MG PO TABS
5.0000 mg | ORAL_TABLET | Freq: Every evening | ORAL | Status: DC | PRN
  Administered 2013-05-10 – 2013-05-11 (×2): 5 mg via ORAL
  Filled 2013-05-10 (×2): qty 1

## 2013-05-10 MED ORDER — NIFEDIPINE ER 30 MG PO TB24
30.0000 mg | ORAL_TABLET | Freq: Two times a day (BID) | ORAL | Status: DC
  Administered 2013-05-10: 30 mg via ORAL
  Filled 2013-05-10 (×2): qty 1

## 2013-05-10 MED ORDER — ACETAMINOPHEN 325 MG PO TABS
650.0000 mg | ORAL_TABLET | Freq: Once | ORAL | Status: AC
  Administered 2013-05-10: 650 mg via ORAL
  Filled 2013-05-10: qty 2

## 2013-05-10 MED ORDER — ACETAMINOPHEN 325 MG PO TABS
650.0000 mg | ORAL_TABLET | ORAL | Status: DC | PRN
  Administered 2013-05-10 – 2013-05-11 (×2): 650 mg via ORAL
  Filled 2013-05-10 (×2): qty 2

## 2013-05-10 MED ORDER — BETAMETHASONE SOD PHOS & ACET 6 (3-3) MG/ML IJ SUSP
12.0000 mg | INTRAMUSCULAR | Status: AC
  Administered 2013-05-10 – 2013-05-11 (×2): 12 mg via INTRAMUSCULAR
  Filled 2013-05-10 (×2): qty 2

## 2013-05-10 MED ORDER — CALCIUM CARBONATE ANTACID 500 MG PO CHEW: 2.0000 | CHEWABLE_TABLET | ORAL | Status: DC | PRN

## 2013-05-10 MED ORDER — PRENATAL MULTIVITAMIN CH
1.0000 | ORAL_TABLET | Freq: Every day | ORAL | Status: DC
  Administered 2013-05-11 – 2013-05-12 (×2): 1 via ORAL
  Filled 2013-05-10 (×2): qty 1

## 2013-05-10 NOTE — ED Notes (Signed)
[facility] [place] [facility] with Odessa FlemingErin, OB RN.

## 2013-05-10 NOTE — Progress Notes (Signed)
This note also relates to the following rows which could not be included: Pulse Rate - Cannot attach notes to unvalidated device data SpO2 - Cannot attach notes to unvalidated device data   EFM off, patient to x-ray

## 2013-05-10 NOTE — H&P (Signed)
Vicki Everett is a 23 y.o. female presenting for contractions after a fall earlier today. Pt states that she fell after slipping on the ice earlier today landing on her L side. She denies hitting her head or LOC. She states she started having strong contractions right away which have worsened since. She cannot tell if they are regular. She denies LOF, vaginal bleeding, or decreased fetal movement. She does state she has thick white vaginal discharge which is not malodorous or associated with itching.   She is a patient of [facility] who intends to transfer care here. She live at a pregnancy group home and has had 3 prior spontaneous ABs at 8, 12, and 16 weeks. She has also had a 32 week delivery of a 6lb 6oz infant.    History OB History   Grav Para Term Preterm Abortions TAB SAB Ect Mult Living   5 1  1 3  3   1      Past Medical History  Diagnosis Date  . Asthma   . Vaginal Pap smear, abnormal   . Sciatica    Past Surgical History  Procedure Laterality Date  . Knee surgery  2011 & 2012   Family History: family history is not on file. Social History:  reports that she has quit smoking. She does not have any smokeless tobacco history on file. She reports that she does not drink alcohol or use illicit drugs.   Prenatal Transfer Tool  Maternal Diabetes: No Genetic Screening: Declined Maternal Ultrasounds/Referrals: Normal Fetal Ultrasounds or other Referrals:  None Maternal Substance Abuse:  No Significant Maternal Medications:  Meds include: Other:  Significant Maternal Lab Results:  Lab values include: Other:  Other Comments:  using norco, antibody positive, Anti Kell positive  ROS Per HPI    Blood pressure 110/71, pulse 87, temperature 98.2 F (36.8 C), temperature source Oral, resp. rate 16, SpO2 100.00%. Exam Physical Exam   Gen: NAD, alert, cooperative with exam HEENT: NCAT CV: RRR, good S1/S2, no murmur Resp: CTABL, no wheezes, non-labored Abd: Soft, gravid  uterus, no tenderness to palpation Ext: No edema, warm Neuro: Alert and oriented, No gross deficits  Dilation: 1 Effacement (%): Thick Cervical Position: Posterior Station: Ballotable Presentation: Vertex Exam by:: Dr. Penne LashLeggett  FHT: baseline 145, Positive accels, No decels, moderate variabilty Toco: irritability without apparent contractions  Prenatal labs: ABO, Rh:   AB positive Antibody:   positive, anti-Kell positive Rubella:   immune RPR:   nonreactive HBsAg:   negative HIV:   negative GBS:   not performed yet  Assessment/Plan: 23 y/o G5P0131 at 28.3  with history of preterm delivery at 32 weeks here with contractions after a fall this morning.   Labor/Contractions- contractions per patient but cervix only dilated to 1 cm currently  - Give BMZ and start tocolysis with procardia  - Monitor overnight  - will recheck cervix tomorrow to ensure no change  Fetal well being- Category 1 strip  Antibody positive, Kell positive  - Hx of 3 spontaneous losses  - Type and screen, keep 2 units PRBC ahead  Fall - Plain film of L hip with no acute abnormalities, tylenol for pain  Asthma - Lungs CTAB currently, albuterol if needed   Kevin FentonBradshaw, Samuel 05/10/2013, 8:19 PM  I spoke with and examined patient and agree with resident's note and plan of care.  Tawana ScaleMichael Ryan Kaitlynne Wenz, MD OB Fellow 05/10/2013 9:27 PM

## 2013-05-10 NOTE — ED Notes (Signed)
Fetal Tones by doppler 150's.

## 2013-05-10 NOTE — Progress Notes (Addendum)
1600  Arrived to evaluate this 23 yo G5P1 @28 .3wks status post fall.  Reports slipped and fell, landing on left side. Complains of left hip pain.  Also complains of contractions.  Reports good fetal movement.  Denies vaginal bleeding of LOF.  Patient is currently awaiting x-ray of left hip.  1650  Call placed to Dr. Penne LashLeggett of [facility].  Notified of patient history and complaints and of EFM and UC's.  Recommends transfer to Antenatal unit for 24 hour observation following x-ray completion.1850  Awaiting [facility] for transfer to antenatal room 156.

## 2013-05-10 NOTE — ED Notes (Signed)
OB nurse to be paged by Secretary

## 2013-05-10 NOTE — ED Notes (Signed)
OB Rapid Response Nurse at bedside.

## 2013-05-10 NOTE — ED Provider Notes (Signed)
CSN: [id number]     Arrival date & time 05/10/13  1436 History   First MD Initiated Contact with Patient 05/10/13 1505     Chief Complaint  Patient presents with  . Fall     (Consider location/radiation/quality/duration/timing/severity/associated sxs/prior Treatment) HPI Comments: 23 yo female with 3 miscarriage hx and currently [redacted] wks pregnant, follows OB outpt, has had normal [place] per pt, intrauterine single presents with left hip pain from slip [place] of court house on [place], pain with rom.  No head or abdo injury. No abdo pain.  Intermittent contractions similar to past few weeks, no vaginal bleeding.  No issues with this pregnancy.   Patient is a 23 y.o. female presenting with fall. The history is provided by the patient.  Fall Pertinent negatives include no chest pain, no abdominal pain, no headaches and no shortness of breath.    Past Medical History  Diagnosis Date  . Asthma   . Vaginal Pap smear, abnormal   . Sciatica    Past Surgical History  Procedure Laterality Date  . Knee surgery  2011 & 2012   History reviewed. No pertinent family history. History  Substance Use Topics  . Smoking status: Former Smoker -- 0.25 packs/day  . Smokeless tobacco: Not on file  . Alcohol Use: No   OB History   Grav Para Term Preterm Abortions TAB SAB Ect Mult Living   5 1  1 3  3   1      Review of Systems  Constitutional: Negative for fever and chills.  HENT: Negative for congestion.   Eyes: Negative for visual disturbance.  Respiratory: Negative for shortness of breath.   Cardiovascular: Negative for chest pain.  Gastrointestinal: Negative for vomiting and abdominal pain.  Genitourinary: Negative for dysuria and flank pain.  Musculoskeletal: Positive for arthralgias and gait problem. Negative for back pain, neck pain and neck stiffness.  Skin: Negative for rash.  Neurological: Negative for light-headedness and headaches.      Allergies  Sulfa antibiotics and Sodium  chloride  Home Medications   Current Outpatient Rx  Name  Route  Sig  Dispense  Refill  . acetaminophen (TYLENOL) 500 MG tablet   Oral   Take 1,000 mg by mouth daily as needed for mild pain.          Marland Kitchen albuterol (PROVENTIL HFA;VENTOLIN HFA) 108 (90 BASE) MCG/ACT inhaler   Inhalation   Inhale 2 puffs into the lungs every 6 (six) hours as needed for wheezing or shortness of breath.          Marland Kitchen HYDROcodone-acetaminophen (NORCO/VICODIN) 5-325 MG per tablet   Oral   Take 1-2 tablets by mouth every 6 (six) hours as needed (for pain).   20 tablet   0   . polyethylene glycol powder (MIRALAX) powder   Oral   Take 17 g by mouth 2 (two) times daily.   255 g   1   . promethazine (PHENERGAN) 12.5 MG tablet   Oral   Take 1 tablet (12.5 mg total) by mouth every 6 (six) hours as needed for nausea or vomiting.   15 tablet   0    BP 116/81  Pulse 111  Temp(Src) 98.7 F (37.1 C)  Resp 18  SpO2 100% Physical Exam  Nursing note and vitals reviewed. Constitutional: She is oriented to person, place, and time. She appears well-developed and well-nourished.  HENT:  Head: Normocephalic and atraumatic.  Eyes: Conjunctivae are normal. Right eye exhibits no discharge. Left  eye exhibits no discharge.  Neck: Normal range of motion. Neck supple. No tracheal deviation present.  Cardiovascular: Regular rhythm.  Tachycardia present.   Pulmonary/Chest: Effort normal and breath sounds normal.  Abdominal: Soft. She exhibits no distension. There is no tenderness (gravid). There is no guarding.  Musculoskeletal: She exhibits tenderness (left hip with rom, nv intact distal, no knee pain bilateral or ankle, no midline back pain, full rom of neck and head). She exhibits no edema.  Neurological: She is alert and oriented to person, place, and time.  Skin: Skin is warm. No rash noted.  Psychiatric: She has a normal mood and affect.    ED Course  Procedures (including critical care time) EMERGENCY  DEPARTMENT US PREGNANCY "Study: Limited Ultrasound of the Pelvis for Pregnancy"  INDICATIONS:Pregnancy(required)  Contractions post fall injury Multiple views of the uterus and pelvic cavity were obtained in real-time with a multi-frequency probe.  APPROACH:Transabdominal   PERFORMED BY: Myself  IMAGES ARCHIVED?: Yes  LIMITATIONS: none  PREGNANCY FREE FLUID: None  PREGNANCY FINDINGS: Fetal pole present and Fetal heart activity seen  INTERPRETATION: Viable intrauterine pregnancy    FETAL HEART RATE: 143     Labs Review Labs Reviewed - No data to display Imaging Review No results found.  EKG Interpretation   None       MDM   Final diagnoses:  None   Fall with focal injury, pain with walking, difficulty placing weight on hip.   Discussed small potential risk of xray however pregnancy is second trimester, pt agrees with xray, discussed with radiology.  Xray no acute findings. OB nurse consulted for fetal monitoring, OB rec transfer.  Pt having irritability, no vaginal bleeding or fluid however hx of preterm delivery. Transferred to [facility], Dr Toney SangLeggetta accepted in stable condition.  The patients results and plan were reviewed and discussed.   Any x-rays performed were personally reviewed by myself.   Differential diagnosis were considered with the presenting HPI.  Admission/ transfer/ observation were discussed with the admitting physician, patient and/or family and they are comfortable with the plan.   Fall, Left hip pain, Uterine irritability    Enid SkeensJoshua M Annalynn Centanni, MD 05/10/13 1827

## 2013-05-10 NOTE — ED Notes (Signed)
Pt out of room for XRAY.

## 2013-05-10 NOTE — ED Notes (Addendum)
[facility] [place] landing on left side. 28 weeks preg G5 P1 3 miscarriages. Currently having braxton hicks but says this is her norm. Able to ambulate w/o difficulty. Alert/orientx4, skin dry and intact. Spinal clearance.  Pain 5/10. Hx of asthma.

## 2013-05-10 NOTE — ED Notes (Signed)
EMTALA reviewed by [employer] RN Sabrina.

## 2013-05-10 NOTE — ED Notes (Signed)
[facility] period.

## 2013-05-11 DIAGNOSIS — W010XXA Fall on same level from slipping, tripping and stumbling without subsequent striking against object, initial encounter: Secondary | ICD-10-CM

## 2013-05-11 DIAGNOSIS — O4700 False labor before 37 completed weeks of gestation, unspecified trimester: Principal | ICD-10-CM

## 2013-05-11 LAB — MRSA PCR SCREENING: MRSA BY PCR: NEGATIVE

## 2013-05-11 MED ORDER — FAMOTIDINE 20 MG PO TABS
40.0000 mg | ORAL_TABLET | Freq: Every day | ORAL | Status: DC
  Administered 2013-05-11 – 2013-05-12 (×2): 40 mg via ORAL
  Filled 2013-05-11 (×2): qty 2

## 2013-05-11 MED ORDER — FAMOTIDINE 40 MG PO TABS: 40.0000 mg | ORAL_TABLET | Freq: Every day | ORAL | Status: AC

## 2013-05-11 MED ORDER — SODIUM CHLORIDE 0.9 % IJ SOLN
3.0000 mL | Freq: Two times a day (BID) | INTRAMUSCULAR | Status: DC
  Administered 2013-05-11: 3 mL via INTRAVENOUS

## 2013-05-11 NOTE — Progress Notes (Signed)
UR completed 
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
## 2013-05-11 NOTE — Progress Notes (Signed)
Social worker in talking to pt

## 2013-05-11 NOTE — Discharge Instructions (Signed)
Preterm Labor Information Preterm labor is when labor starts at less than 37 weeks of pregnancy. The normal length of a pregnancy is 39 to 41 weeks. CAUSES Often, there is no identifiable underlying cause as to why a woman goes into preterm labor. One of the most common known causes of preterm labor is infection. Infections of the uterus, cervix, vagina, amniotic sac, bladder, kidney, or even the lungs (pneumonia) can cause labor to start. Other suspected causes of preterm labor include:   Urogenital infections, such as yeast infections and bacterial vaginosis.   Uterine abnormalities (uterine shape, uterine septum, fibroids, or bleeding from the placenta).   A cervix that has been operated on (it may fail to stay closed).   Malformations in the fetus.   Multiple gestations (twins, triplets, and so on).   Breakage of the amniotic sac.  RISK FACTORS  Having a previous history of preterm labor.   Having premature rupture of membranes (PROM).   Having a placenta that covers the opening of the cervix (placenta previa).   Having a placenta that separates from the uterus (placental abruption).   Having a cervix that is too weak to hold the fetus in the uterus (incompetent cervix).   Having too much fluid in the amniotic sac (polyhydramnios).   Taking illegal drugs or smoking while pregnant.   Not gaining enough weight while pregnant.   Being younger than 18 and older than 22 years old.   Having a low socioeconomic status.   Being African American. SYMPTOMS Signs and symptoms of preterm labor include:   Menstrual-like cramps, abdominal pain, or back pain.  Uterine contractions that are regular, as frequent as six in an hour, regardless of their intensity (may be mild or painful).  Contractions that start on the top of the uterus and spread down to the lower abdomen and back.   A sense of increased pelvic pressure.   A watery or bloody mucus discharge that  comes from the vagina.  TREATMENT Depending on the length of the pregnancy and other circumstances, your health care provider may suggest bed rest. If necessary, there are medicines that can be given to stop contractions and to mature the fetal lungs. If labor happens before 34 weeks of pregnancy, a prolonged hospital stay may be recommended. Treatment depends on the condition of both you and the fetus.  WHAT SHOULD YOU DO IF YOU THINK YOU ARE IN PRETERM LABOR? Call your health care provider right away. You will need to go to the hospital to get checked immediately. HOW CAN YOU PREVENT PRETERM LABOR IN FUTURE PREGNANCIES? You should:   Stop smoking if you smoke.  Maintain healthy weight gain and avoid chemicals and drugs that are not necessary.  Be watchful for any type of infection.  Inform your health care provider if you have a known history of preterm labor. Document Released: 05/31/2003 Document Revised: 11/10/2012 Document Reviewed: 04/12/2012 ExitCare Patient Information 2014 [employer].    

## 2013-05-11 NOTE — Progress Notes (Signed)
PT on the phone in a heated conversation because the FOB is not coming to see her because [place] of a death in the family.

## 2013-05-11 NOTE — Progress Notes (Signed)
Patient ID: Vicki Everett, female   DOB: [birth year], 23 y.o.   MRN: [id number] FACULTY PRACTICE ANTEPARTUM(COMPREHENSIVE) NOTE  Vicki Everett is a 23 y.o. [id number] at 8155w4d  who is admitted for status post fall and preterm contractions.   Length of Stay:  1  Days  Subjective:  Patient reports the fetal movement as active. Patient reports uterine contraction  activity as rare. Patient reports  vaginal bleeding as none. Patient describes fluid per vagina as None.  Vitals:  Blood pressure 127/65, pulse 99, temperature 98.6 F (37 C), temperature source Oral, resp. rate 20, height 5\' 6"  (1.676 m), weight 185 lb (83.915 kg), SpO2 100.00%. Physical Examination:  General appearance - alert, well appearing, and in no distress Abdomen - soft nontender Extremities - no edema, redness or tenderness in the calves or thighs, Homan's sign negative bilaterally  Fetal Monitoring:  Baseline: 140 bpm, Variability: Good {> 6 bpm), Accelerations: Reactive and Decelerations: Absent  Labs:  Results for orders placed during the hospital encounter of 05/10/13 (from the past 24 hour(s))  FETAL FIBRONECTIN   Collection Time    05/10/13  8:20 PM      Result Value Ref Range   Fetal Fibronectin NEGATIVE  NEGATIVE  CBC   Collection Time    05/10/13  8:30 PM      Result Value Ref Range   WBC 10.2  4.0 - 10.5 K/uL   RBC 3.66 (*) 3.87 - 5.11 MIL/uL   Hemoglobin 10.4 (*) 12.0 - 15.0 g/dL   HCT 56.230.3 (*) 13.036.0 - 86.546.0 %   MCV 82.8  78.0 - 100.0 fL   MCH 28.4  26.0 - 34.0 pg   MCHC 34.3  30.0 - 36.0 g/dL   RDW 78.412.6  69.611.5 - 29.515.5 %   Platelets 172  150 - 400 K/uL  TYPE AND SCREEN   Collection Time    05/10/13  9:30 PM      Result Value Ref Range   ABO/RH(D) AB POS     Antibody Screen POS     Sample Expiration 05/13/2013     DAT, IgG NEG     Antibody Identification ANTI-K     PT AG Type NEGATIVE FOR KELL ANTIGEN     Unit Number [id number]     Blood Component Type RED CELLS,LR     Unit division 00     Status of Unit ALLOCATED     Transfusion Status OK TO TRANSFUSE     Crossmatch Result COMPATIBLE     Unit Number [id number]     Blood Component Type RBC LR PHER2     Unit division 00     Status of Unit ALLOCATED     Transfusion Status OK TO TRANSFUSE     Crossmatch Result COMPATIBLE    MRSA PCR SCREENING   Collection Time    05/10/13 11:35 PM      Result Value Ref Range   MRSA by PCR NEGATIVE  NEGATIVE    Medications:  Scheduled . betamethasone acetate-betamethasone sodium phosphate  12 mg Intramuscular Q24 Hr x 2  . docusate sodium  100 mg Oral Daily  . NIFEdipine  30 mg Oral BID  . prenatal multivitamin  1 tablet Oral Q1200   I have reviewed the patient's current medications.  ASSESSMENT: Patient Active Problem List   Diagnosis Date Noted  . Preterm labor 05/10/2013    PLAN: Vicki Everett is a 23 y.o. [id number] at 6155w4d  who is admitted for status post fall  and preterm contractions.   1-FFN negative.  Will stop procardia.  Continue betamethasone.  Pt reports preterm delivery at 32 weeks but [facility] records indicate a 37 week delivery (infant weighed 6+ pounds per pr) 2-Needs GCT in 10 days 3-SW   Jazmyne Beauchesne H. 05/11/2013,7:08 AM

## 2013-05-12 MED ORDER — IBUPROFEN 600 MG PO TABS: 600.0000 mg | ORAL_TABLET | Freq: Four times a day (QID) | ORAL | Status: DC | PRN

## 2013-05-12 NOTE — Progress Notes (Signed)
CSW [facility] for [facility] requesting a call back as soon as possible to inform that patient is ready for discharge and needs to be picked up.  CSW will call back shortly if message is not returned.

## 2013-05-12 NOTE — Progress Notes (Signed)
Patient slipped on ice. Fall was not due to dizziness, or problems with gait.

## 2013-05-12 NOTE — Discharge Summary (Signed)
Physician Discharge Summary  Patient ID: Vicki Everett MRN: [id number] DOB/AGE: [birth year] 21 y.o.  Admit date: 05/10/2013 Discharge date: 05/12/2013  Admission Diagnoses:  Discharge Diagnoses:  Active Problems:   Preterm labor   Discharged Condition: good  Hospital Course: admitted 2/17 after [facility]. She fell on her left side and had some low abdominal pressure and irregular contractions. NO bleeding or LOF and fetal movement was noted.  Consults: None  Significant Diagnostic Studies: fetal monitoring  Treatments: IV hydration  Discharge Exam: Blood pressure 119/59, pulse 112, temperature 98.8 F (37.1 C), temperature source Oral, resp. rate 16, height 5\' 6"  (1.676 m), weight 185 lb (83.915 kg), SpO2 100.00%. General appearance: alert, cooperative and no distress Head: Normocephalic, without obvious abnormality, atraumatic GI: gravid not tender Pelvic: external genitalia normal, no cervical motion tenderness, vagina normal without discharge and cervix long closed today Extremities: extremities normal, atraumatic, no cyanosis or edema  Disposition: 01-Home or Self Care     Medication List         acetaminophen 500 MG tablet  Commonly known as:  TYLENOL  Take 1,000 mg by mouth daily as needed for mild pain.     albuterol 108 (90 BASE) MCG/ACT inhaler  Commonly known as:  PROVENTIL HFA;VENTOLIN HFA  Inhale 2 puffs into the lungs every 6 (six) hours as needed for wheezing or shortness of breath.     famotidine 40 MG tablet  Commonly known as:  PEPCID  Take 1 tablet (40 mg total) by mouth daily.     HYDROcodone-acetaminophen 5-325 MG per tablet  Commonly known as:  NORCO/VICODIN  Take 1-2 tablets by mouth every 6 (six) hours as needed (for pain).     ibuprofen 600 MG tablet  Commonly known as:  ADVIL,MOTRIN  Take 1 tablet (600 mg total) by mouth every 6 (six) hours as needed.     polyethylene glycol powder powder  Commonly known  as:  MIRALAX  Take 17 g by mouth 2 (two) times daily.     promethazine 12.5 MG tablet  Commonly known as:  PHENERGAN  Take 1 tablet (12.5 mg total) by mouth every 6 (six) hours as needed for nausea or vomiting.           Follow-up Information   Follow up with [facility] In 1 week.   Contact information:   [address] [place] [address]       Signed: Scheryl DarterRNOLD,JAMES 05/12/2013, 7:18 AM

## 2013-05-12 NOTE — Progress Notes (Addendum)
Patient understands discharge instructions and she understands her medications. She was also instructed on signs and symptoms of premature labor.   She will be discharged with a cab voucher back to [facility].

## 2013-05-12 NOTE — Progress Notes (Signed)
[facility] here to [facility] up patient at approximately 12:30.  CSW informed bedside RN.

## 2013-05-13 LAB — CULTURE, BETA STREP (GROUP B ONLY)

## 2013-05-14 ENCOUNTER — Inpatient Hospital Stay (HOSPITAL_COMMUNITY)
Admission: AD | Admit: 2013-05-14 | Discharge: 2013-05-14 | Disposition: A | Discharge status: Home / Self Care | Payer: Medicaid Other | Source: Ambulatory Visit | Attending: Obstetrics & Gynecology | Admitting: Obstetrics & Gynecology

## 2013-05-14 ENCOUNTER — Encounter (HOSPITAL_COMMUNITY): Payer: Self-pay | Admitting: *Deleted

## 2013-05-14 DIAGNOSIS — Z87891 Personal history of nicotine dependence: Secondary | ICD-10-CM | POA: Insufficient documentation

## 2013-05-14 DIAGNOSIS — R109 Unspecified abdominal pain: Secondary | ICD-10-CM | POA: Insufficient documentation

## 2013-05-14 DIAGNOSIS — O4700 False labor before 37 completed weeks of gestation, unspecified trimester: Secondary | ICD-10-CM | POA: Insufficient documentation

## 2013-05-14 DIAGNOSIS — E860 Dehydration: Secondary | ICD-10-CM | POA: Insufficient documentation

## 2013-05-14 DIAGNOSIS — M549 Dorsalgia, unspecified: Secondary | ICD-10-CM | POA: Insufficient documentation

## 2013-05-14 DIAGNOSIS — O479 False labor, unspecified: Secondary | ICD-10-CM

## 2013-05-14 LAB — TYPE AND SCREEN
ABO/RH(D): AB POS
Antibody Screen: POSITIVE
DAT, IgG: NEGATIVE
DONOR AG TYPE: NEGATIVE
Donor AG Type: NEGATIVE
PT AG TYPE: NEGATIVE
UNIT DIVISION: 0
Unit division: 0

## 2013-05-14 LAB — URINALYSIS, ROUTINE W REFLEX MICROSCOPIC
Bilirubin Urine: NEGATIVE
Glucose, UA: NEGATIVE mg/dL
HGB URINE DIPSTICK: NEGATIVE
Ketones, ur: NEGATIVE mg/dL
Leukocytes, UA: NEGATIVE
Nitrite: NEGATIVE
PROTEIN: NEGATIVE mg/dL
Specific Gravity, Urine: >1.03 — ABNORMAL HIGH (ref 1.005–1.030)
Urobilinogen, UA: 2 mg/dL — ABNORMAL HIGH (ref 0.0–1.0)
pH: 6 (ref 5.0–8.0)

## 2013-05-14 MED ORDER — NIFEDIPINE 10 MG PO CAPS
10.0000 mg | ORAL_CAPSULE | ORAL | Status: DC | PRN
  Administered 2013-05-14 (×2): 10 mg via ORAL
  Filled 2013-05-14 (×2): qty 1

## 2013-05-14 NOTE — MAU Provider Note (Signed)
History     CSN: [id number]  Arrival date and time: 05/14/13 0016   None     Chief Complaint  Patient presents with  . Labor Eval   HPI Ms Tewell is a 22yo [phone] at 29.0wks who presents for eval of abd discomfort, back pain and 'pressure'. Denies leaking or bldg. Reports +FM. Denies dysuria. She was recently discharged on the evening of 2/19 from antenatal after being admitted s/p fall on ice followed by. She is currently living at [facility] and is starting care at the [facility] next week. She has a hx of PTD @ 32wks.  OB History   Grav Para Term Preterm Abortions TAB SAB Ect Mult Living   5 1  1 3  3   1       Past Medical History  Diagnosis Date  . Asthma   . Vaginal Pap smear, abnormal   . Sciatica     Past Surgical History  Procedure Laterality Date  . Knee surgery  2011 & 2012    History reviewed. No pertinent family history.  History  Substance Use Topics  . Smoking status: Former Smoker -- 0.25 packs/day  . Smokeless tobacco: Not on file  . Alcohol Use: No    Allergies:  Allergies  Allergen Reactions  . Sulfa Antibiotics Anaphylaxis and Itching  . Sodium Chloride Hives and Itching    IV sodium chloride caused hives and itching.    Prescriptions prior to admission  Medication Sig Dispense Refill  . acetaminophen (TYLENOL) 500 MG tablet Take 1,000 mg by mouth daily as needed for mild pain.       Marland Kitchen albuterol (PROVENTIL HFA;VENTOLIN HFA) 108 (90 BASE) MCG/ACT inhaler Inhale 2 puffs into the lungs every 6 (six) hours as needed for wheezing or shortness of breath.       . famotidine (PEPCID) 40 MG tablet Take 1 tablet (40 mg total) by mouth daily.  30 tablet  2  . HYDROcodone-acetaminophen (NORCO/VICODIN) 5-325 MG per tablet Take 1-2 tablets by mouth every 6 (six) hours as needed (for pain).  20 tablet  0  . ibuprofen (ADVIL,MOTRIN) 600 MG tablet Take 1 tablet (600 mg total) by mouth every 6 (six) hours as needed.  20 tablet  1  . polyethylene glycol powder  (MIRALAX) powder Take 17 g by mouth 2 (two) times daily.  255 g  1  . promethazine (PHENERGAN) 12.5 MG tablet Take 1 tablet (12.5 mg total) by mouth every 6 (six) hours as needed for nausea or vomiting.  15 tablet  0    ROS Physical Exam   Blood pressure 122/70, pulse 102, temperature 98.8 F (37.1 C), temperature source Oral, resp. rate 18, height 5\' 6"  (1.676 m).  Physical Exam  Constitutional: She is oriented to person, place, and time. She appears well-developed.  HENT:  Head: Normocephalic.  Neck: Normal range of motion.  Cardiovascular:  Sl tachycardic  Respiratory: Effort normal.  GI:  FHR 140s, +accels Ctx q 3-4/possibly irritability?  Genitourinary: Vagina normal.  Cx post/C/50%/high  Musculoskeletal: Normal range of motion.  Neurological: She is alert and oriented to person, place, and time.  Skin: Skin is warm and dry.  Psychiatric: She has a normal mood and affect. Her behavior is normal. Thought content normal.   Urinalysis    Component Value Date/Time   COLORURINE YELLOW 05/14/2013 0026   APPEARANCEUR CLEAR 05/14/2013 0026   LABSPEC >1.030* 05/14/2013 0026   PHURINE 6.0 05/14/2013 0026   GLUCOSEU  NEGATIVE 05/14/2013 0026   HGBUR NEGATIVE 05/14/2013 0026   BILIRUBINUR NEGATIVE 05/14/2013 0026   KETONESUR NEGATIVE 05/14/2013 0026   PROTEINUR NEGATIVE 05/14/2013 0026   UROBILINOGEN 2.0* 05/14/2013 0026   NITRITE NEGATIVE 05/14/2013 0026   LEUKOCYTESUR NEGATIVE 05/14/2013 0026     MAU Course  Procedures    Assessment and Plan  IUP at 29.0 Preterm ctx without cx change Dehydration  D/C home as ctx resolved after Procardia 10mg  x 2 Given PTL precautions including rec to increase water intake to 64+oz/d F/U as scheduled at next appointment  Cam HaiSHAW, Darlis Wragg 05/14/2013, 12:55 AM

## 2013-05-14 NOTE — MAU Note (Signed)
pt reports she was admited to hospital this week after falling. Discharged Wed. Started having ctx tonight got stronger around 1030 this evening. Denies SROMor bleeding and reports good fetal movement.

## 2013-05-14 NOTE — Discharge Instructions (Signed)
Preterm Labor Information Preterm labor is when labor starts at less than 37 weeks of pregnancy. The normal length of a pregnancy is 39 to 41 weeks. CAUSES Often, there is no identifiable underlying cause as to why a woman goes into preterm labor. One of the most common known causes of preterm labor is infection. Infections of the uterus, cervix, vagina, amniotic sac, bladder, kidney, or even the lungs (pneumonia) can cause labor to start. Other suspected causes of preterm labor include:   Urogenital infections, such as yeast infections and bacterial vaginosis.   Uterine abnormalities (uterine shape, uterine septum, fibroids, or bleeding from the placenta).   A cervix that has been operated on (it may fail to stay closed).   Malformations in the fetus.   Multiple gestations (twins, triplets, and so on).   Breakage of the amniotic sac.  RISK FACTORS  Having a previous history of preterm labor.   Having premature rupture of membranes (PROM).   Having a placenta that covers the opening of the cervix (placenta previa).   Having a placenta that separates from the uterus (placental abruption).   Having a cervix that is too weak to hold the fetus in the uterus (incompetent cervix).   Having too much fluid in the amniotic sac (polyhydramnios).   Taking illegal drugs or smoking while pregnant.   Not gaining enough weight while pregnant.   Being younger than 18 and older than 22 years old.   Having a low socioeconomic status.   Being African American. SYMPTOMS Signs and symptoms of preterm labor include:   Menstrual-like cramps, abdominal pain, or back pain.  Uterine contractions that are regular, as frequent as six in an hour, regardless of their intensity (may be mild or painful).  Contractions that start on the top of the uterus and spread down to the lower abdomen and back.   A sense of increased pelvic pressure.   A watery or bloody mucus discharge that  comes from the vagina.  TREATMENT Depending on the length of the pregnancy and other circumstances, your health care provider may suggest bed rest. If necessary, there are medicines that can be given to stop contractions and to mature the fetal lungs. If labor happens before 34 weeks of pregnancy, a prolonged hospital stay may be recommended. Treatment depends on the condition of both you and the fetus.  WHAT SHOULD YOU DO IF YOU THINK YOU ARE IN PRETERM LABOR? Call your health care provider right away. You will need to go to the hospital to get checked immediately. HOW CAN YOU PREVENT PRETERM LABOR IN FUTURE PREGNANCIES? You should:   Stop smoking if you smoke.  Maintain healthy weight gain and avoid chemicals and drugs that are not necessary.  Be watchful for any type of infection.  Inform your health care provider if you have a known history of preterm labor. Document Released: 05/31/2003 Document Revised: 11/10/2012 Document Reviewed: 04/12/2012 ExitCare Patient Information 2014 [employer].    

## 2013-05-15 ENCOUNTER — Other Ambulatory Visit: Payer: Self-pay | Admitting: Obstetrics & Gynecology

## 2013-05-15 ENCOUNTER — Encounter: Payer: Self-pay | Admitting: Obstetrics & Gynecology

## 2013-05-15 DIAGNOSIS — O361930 Maternal care for other isoimmunization, third trimester, not applicable or unspecified: Secondary | ICD-10-CM

## 2013-05-15 DIAGNOSIS — O361990 Maternal care for other isoimmunization, unspecified trimester, not applicable or unspecified: Secondary | ICD-10-CM | POA: Insufficient documentation

## 2013-05-15 NOTE — Progress Notes (Signed)
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  Please have pt come in for blood draw.

## 2013-05-16 NOTE — Progress Notes (Addendum)
Called pt who stated she had the antibody titer drawn again today at her provider, [facility]. Pt. States she is being followed there and this happened with her last child as well. No lab appointment needed at this time.

## 2013-05-17 NOTE — Progress Notes (Signed)
Spoke to pt. Yesterday via phone in attempt to schedule a lab appointment for pt. To have antibody titer drawn as she had tested positive for kell antibodies when she was here in MAU. Pt. Stated " oh the lab work for the antibodies? I had that blood drawn today at my Doctor's. [facility]. This happened with my last baby. So I'm all set." I confirmed that pt. Is being seen at [facility] regularly and pt. Stated that she is being followed closely and does not wish to come to clinic for care at this time. Informed pt. That as long as she is receiving care regularly at this office and being followed closely for those titers then she is OK but if she wishes to come to our clinic to give us a call. Pt. Verbalized understanding and gratitude and had no further questions. No lab appointment scheduled.

## 2013-05-27 ENCOUNTER — Encounter: Payer: Medicaid Other | Admitting: Obstetrics & Gynecology

## 2013-06-04 ENCOUNTER — Encounter (HOSPITAL_COMMUNITY): Payer: Self-pay | Admitting: *Deleted

## 2013-06-04 ENCOUNTER — Inpatient Hospital Stay (HOSPITAL_COMMUNITY)
Admission: AD | Admit: 2013-06-04 | Discharge: 2013-06-04 | Disposition: A | Discharge status: Home / Self Care | Payer: Medicaid Other | Source: Ambulatory Visit | Attending: Obstetrics & Gynecology | Admitting: Obstetrics & Gynecology

## 2013-06-04 DIAGNOSIS — Z87891 Personal history of nicotine dependence: Secondary | ICD-10-CM | POA: Insufficient documentation

## 2013-06-04 DIAGNOSIS — O479 False labor, unspecified: Secondary | ICD-10-CM

## 2013-06-04 DIAGNOSIS — R109 Unspecified abdominal pain: Secondary | ICD-10-CM | POA: Insufficient documentation

## 2013-06-04 DIAGNOSIS — O4700 False labor before 37 completed weeks of gestation, unspecified trimester: Secondary | ICD-10-CM | POA: Insufficient documentation

## 2013-06-04 DIAGNOSIS — M5430 Sciatica, unspecified side: Secondary | ICD-10-CM | POA: Insufficient documentation

## 2013-06-04 LAB — URINALYSIS, ROUTINE W REFLEX MICROSCOPIC
Bilirubin Urine: NEGATIVE
Glucose, UA: NEGATIVE mg/dL
Hgb urine dipstick: NEGATIVE
KETONES UR: NEGATIVE mg/dL
LEUKOCYTES UA: NEGATIVE
Nitrite: NEGATIVE
PROTEIN: NEGATIVE mg/dL
Specific Gravity, Urine: 1.015 (ref 1.005–1.030)
UROBILINOGEN UA: 0.2 mg/dL (ref 0.0–1.0)
pH: 6.5 (ref 5.0–8.0)

## 2013-06-04 LAB — WET PREP, GENITAL
Clue Cells Wet Prep HPF POC: NONE SEEN
Trich, Wet Prep: NONE SEEN
Yeast Wet Prep HPF POC: NONE SEEN

## 2013-06-04 LAB — FETAL FIBRONECTIN: FETAL FIBRONECTIN: NEGATIVE

## 2013-06-04 MED ORDER — NIFEDIPINE 10 MG PO CAPS: 10.0000 mg | ORAL_CAPSULE | ORAL | Status: DC | PRN

## 2013-06-04 NOTE — Discharge Instructions (Signed)
You can take 650mg  (2 regular strength) every 4 hours as needed, or 1,000mg  (2 extra strength) every 6 hours as needed for hip/pelvis pain. Wear your maternity belt daily. Warm baths may also help.  If you plan on switching care [facility], please call them Monday to arrange this. [phone]  Preterm Labor Information Preterm labor is when labor starts at less than 37 weeks of pregnancy. The normal length of a pregnancy is 39 to 41 weeks. CAUSES Often, there is no identifiable underlying cause as to why a woman goes into preterm labor. One of the most common known causes of preterm labor is infection. Infections of the uterus, cervix, vagina, amniotic sac, bladder, kidney, or even the lungs (pneumonia) can cause labor to start. Other suspected causes of preterm labor include:   Urogenital infections, such as yeast infections and bacterial vaginosis.   Uterine abnormalities (uterine shape, uterine septum, fibroids, or bleeding from the placenta).   A cervix that has been operated on (it may fail to stay closed).   Malformations in the fetus.   Multiple gestations (twins, triplets, and so on).   Breakage of the amniotic sac.  RISK FACTORS  Having a previous history of preterm labor.   Having premature rupture of membranes (PROM).   Having a placenta that covers the opening of the cervix (placenta previa).   Having a placenta that separates from the uterus (placental abruption).   Having a cervix that is too weak to hold the fetus in the uterus (incompetent cervix).   Having too much fluid in the amniotic sac (polyhydramnios).   Taking illegal drugs or smoking while pregnant.   Not gaining enough weight while pregnant.   Being younger than 7818 and older than 22 years old.   Having a low socioeconomic status.   Being African American. SYMPTOMS Signs and symptoms of preterm labor include:   Menstrual-like cramps, abdominal pain, or back  pain.  Uterine contractions that are regular, as frequent as six in an hour, regardless of their intensity (may be mild or painful).  Contractions that start on the top of the uterus and spread down to the lower abdomen and back.   A sense of increased pelvic pressure.   A watery or bloody mucus discharge that comes from the vagina.  TREATMENT Depending on the length of the pregnancy and other circumstances, your health care provider may suggest bed rest. If necessary, there are medicines that can be given to stop contractions and to mature the fetal lungs. If labor happens before 34 weeks of pregnancy, a prolonged hospital stay may be recommended. Treatment depends on the condition of both you and the fetus.  WHAT SHOULD YOU DO IF YOU THINK YOU ARE IN PRETERM LABOR? Call your health care provider right away. You will need to go to the hospital to get checked immediately. HOW CAN YOU PREVENT PRETERM LABOR IN FUTURE PREGNANCIES? You should:   Stop smoking if you smoke.  Maintain healthy weight gain and avoid chemicals and drugs that are not necessary.  Be watchful for any type of infection.  Inform your health care provider if you have a known history of preterm labor. Document Released: 05/31/2003 Document Revised: 11/10/2012 Document Reviewed: 04/12/2012 [facility] Patient Information 2014 [place], [place].

## 2013-06-04 NOTE — MAU Note (Signed)
C/o ucs since last night around 2000;

## 2013-06-04 NOTE — MAU Provider Note (Signed)
History     CSN: [id number]  Arrival date and time: 06/04/13 1101   None     Chief Complaint  Patient presents with  . Labor Eval   HPI Vicki Everett is a 23 y.o. [id number] female @ [redacted]w[redacted]d who presents w/ report of cramping that began last night. Reports good fm, denies vb, lof, uti s/s. Increased white d/c x 2wks. No odor, itching/irritation.  PNC in [facility], Dr. Janae Sauce, at [facility], had planned on switching here, but hasn't yet. H/O 32wk PTB per what pt has told [place], but [place] records indicate birth was at 37wks, infant weighed 6lb6oz. Was admitted here in Feb for preterm uc's s/p fall, received BMZ on 2/17 & 2/18. States she was supposed to have a fFN in office last week, but didn't make it. Denies recent sexual intercourse/anything in vagina x 24hrs. Also w/ sciatica/hip/and pelvis pain, worse when lying/sitting for long periods. Has maternity belt, but doesn't wear daily. Lives at [facility] [place], and they will only give her 1 tylenol. 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 Janae Sauce.   OB History   Grav Para Term Preterm Abortions TAB SAB Ect Mult Living   5 1  1 3  3   1       Past Medical History  Diagnosis Date  . Asthma   . Vaginal Pap smear, abnormal   . Sciatica     Past Surgical History  Procedure Laterality Date  . Knee surgery  2011 & 2012    Family History  Problem Relation Age of Onset  . Alcohol abuse Neg Hx   . Arthritis Neg Hx   . Asthma Neg Hx   . Birth defects Neg Hx   . Cancer Neg Hx   . COPD Neg Hx   . Depression Neg Hx   . Diabetes Neg Hx   . Drug abuse Neg Hx   . Early death Neg Hx   . Hearing loss Neg Hx   . Hyperlipidemia Neg Hx   . Heart disease Neg Hx   . Hypertension Neg Hx   . Kidney disease Neg Hx   . Learning disabilities Neg Hx   . Mental illness Neg Hx   . Mental retardation Neg Hx   . Miscarriages / Stillbirths Neg Hx   . Stroke Neg Hx   . Vision loss Neg Hx   . Varicose  Veins Neg Hx     History  Substance Use Topics  . Smoking status: Former Smoker -- 0.25 packs/day  . Smokeless tobacco: Not on file  . Alcohol Use: No    Allergies:  Allergies  Allergen Reactions  . Sulfa Antibiotics Anaphylaxis and Itching  . Sodium Chloride Hives and Itching    IV sodium chloride caused hives and itching.    Prescriptions prior to admission  Medication Sig Dispense Refill  . acetaminophen (TYLENOL) 500 MG tablet Take 500 mg by mouth every 6 (six) hours as needed for mild pain.      . famotidine (PEPCID) 40 MG tablet Take 1 tablet (40 mg total) by mouth daily.  30 tablet  2  . promethazine (PHENERGAN) 25 MG tablet Take 25 mg by mouth every 6 (six) hours as needed for nausea or vomiting.      Marland Kitchen albuterol (PROVENTIL HFA;VENTOLIN HFA) 108 (90 BASE) MCG/ACT inhaler Inhale 2 puffs into the lungs every 6 (six) hours as needed for wheezing or shortness of breath.       Marland Kitchen  HYDROcodone-acetaminophen (NORCO/VICODIN) 5-325 MG per tablet Take 1-2 tablets by mouth every 6 (six) hours as needed (for pain).  20 tablet  0  . polyethylene glycol powder (MIRALAX) powder Take 17 g by mouth 2 (two) times daily.  255 g  1    ROS Pertinent +/- as listed in HPI Physical Exam   Blood pressure 117/71, pulse 112, temperature 98.3 F (36.8 C), temperature source Oral, height 5\' 6"  (1.676 m), weight 82.555 kg (182 lb).  Physical Exam  Constitutional: She appears well-developed and well-nourished.  HENT:  Head: Normocephalic.  Neck: Normal range of motion.  Cardiovascular: Normal rate.   GI: Soft. There is no tenderness.  gravid  Genitourinary:  Spec exam: cx visually closed, mod amount thick white nonodorous d/c. fFN, wet prep, gc/ch collected  SVE: LTC, posterior, soft   FHR: 150, mod variability, 15x15accels, no decels=Cat I UCs: ui MAU Course  Procedures  UA Spec exam w/ fFN, wet prep, gc/ch SVE NST PO hydration  Results for orders placed during the hospital  encounter of 06/04/13 (from the past 24 hour(s))  URINALYSIS, ROUTINE W REFLEX MICROSCOPIC     Status: None   Collection Time    06/04/13 11:15 AM      Result Value Ref Range   Color, Urine YELLOW  YELLOW   APPearance CLEAR  CLEAR   Specific Gravity, Urine 1.015  1.005 - 1.030   pH 6.5  5.0 - 8.0   Glucose, UA NEGATIVE  NEGATIVE mg/dL   Hgb urine dipstick NEGATIVE  NEGATIVE   Bilirubin Urine NEGATIVE  NEGATIVE   Ketones, ur NEGATIVE  NEGATIVE mg/dL   Protein, ur NEGATIVE  NEGATIVE mg/dL   Urobilinogen, UA 0.2  0.0 - 1.0 mg/dL   Nitrite NEGATIVE  NEGATIVE   Leukocytes, UA NEGATIVE  NEGATIVE  WET PREP, GENITAL     Status: Abnormal   Collection Time    06/04/13 11:45 AM      Result Value Ref Range   Yeast Wet Prep HPF POC NONE SEEN  NONE SEEN   Trich, Wet Prep NONE SEEN  NONE SEEN   Clue Cells Wet Prep HPF POC NONE SEEN  NONE SEEN   WBC, Wet Prep HPF POC MODERATE (*) NONE SEEN  FETAL FIBRONECTIN     Status: None   Collection Time    06/04/13 11:45 AM      Result Value Ref Range   Fetal Fibronectin NEGATIVE  NEGATIVE   1310: States she's feeling much better, no cramping SVE: unchanged  Assessment and Plan  A:   7313w0d SIUP  [id number]   Preterm ui, resolved w/ po hydration, no cervical change  fFN neg   P:  D/C home  Keep appt in [place] as scheduled on Wed  If planning on switching care to [place], call clinic on Mon, #given  Reviewed ptl s/s, fkc, sciatica/hip/pelvis pain relief   Vicki Everett, Vicki Everett 06/04/2013, 1:19 PM

## 2013-06-05 NOTE — MAU Provider Note (Signed)
Attestation of Attending Supervision of Advanced Practitioner (CNM/NP): Evaluation and management procedures were performed by the Advanced Practitioner under my supervision and collaboration. I have reviewed the Advanced Practitioner's note and chart, and I agree with the management and plan.  Angelis Gates H. 12:25 AM

## 2013-06-06 LAB — GC/CHLAMYDIA PROBE AMP
CT PROBE, AMP APTIMA: NEGATIVE
GC Probe RNA: NEGATIVE

## 2013-06-27 ENCOUNTER — Encounter (HOSPITAL_COMMUNITY): Payer: Self-pay | Admitting: *Deleted

## 2013-06-27 ENCOUNTER — Inpatient Hospital Stay (HOSPITAL_COMMUNITY)
Admission: AD | Admit: 2013-06-27 | Discharge: 2013-06-27 | Disposition: A | Discharge status: Home / Self Care | Payer: Medicaid Other | Source: Ambulatory Visit | Attending: Obstetrics & Gynecology | Admitting: Obstetrics & Gynecology

## 2013-06-27 DIAGNOSIS — R102 Pelvic and perineal pain: Secondary | ICD-10-CM

## 2013-06-27 DIAGNOSIS — O4700 False labor before 37 completed weeks of gestation, unspecified trimester: Secondary | ICD-10-CM | POA: Insufficient documentation

## 2013-06-27 DIAGNOSIS — N949 Unspecified condition associated with female genital organs and menstrual cycle: Secondary | ICD-10-CM | POA: Insufficient documentation

## 2013-06-27 DIAGNOSIS — O26899 Other specified pregnancy related conditions, unspecified trimester: Secondary | ICD-10-CM

## 2013-06-27 DIAGNOSIS — O9989 Other specified diseases and conditions complicating pregnancy, childbirth and the puerperium: Secondary | ICD-10-CM

## 2013-06-27 LAB — RAPID URINE DRUG SCREEN, HOSP PERFORMED
Amphetamines: NOT DETECTED
BARBITURATES: NOT DETECTED
Benzodiazepines: NOT DETECTED
COCAINE: NOT DETECTED
Opiates: NOT DETECTED
TETRAHYDROCANNABINOL: NOT DETECTED

## 2013-06-27 LAB — URINALYSIS, ROUTINE W REFLEX MICROSCOPIC
BILIRUBIN URINE: NEGATIVE
Glucose, UA: NEGATIVE mg/dL
HGB URINE DIPSTICK: NEGATIVE
KETONES UR: NEGATIVE mg/dL
Leukocytes, UA: NEGATIVE
NITRITE: NEGATIVE
Protein, ur: NEGATIVE mg/dL
Specific Gravity, Urine: <1.005 — ABNORMAL LOW (ref 1.005–1.030)
UROBILINOGEN UA: 0.2 mg/dL (ref 0.0–1.0)
pH: 7.5 (ref 5.0–8.0)

## 2013-06-27 NOTE — MAU Note (Signed)
Patient states she has been having contractions every 3 minutes. Denies bleeding or leaking and reports less fetal movement than usual.

## 2013-06-27 NOTE — MAU Provider Note (Signed)
Attestation of Attending Supervision of Fellow: Evaluation and management procedures were performed by the Fellow under my supervision and collaboration.  I have reviewed the Fellow's note and chart, and I agree with the management and plan.    

## 2013-06-27 NOTE — MAU Note (Signed)
C/o ucs since last night @ 1700;

## 2013-06-27 NOTE — MAU Note (Signed)
[facility] the [facility]";

## 2013-06-27 NOTE — MAU Provider Note (Signed)
Vicki Everett 23 y.o. [id number] 8438w2d with care in [place] who presents today for pelvic pressure  SUBJECTIVE  Chief Complaint  Patient presents with  . Labor Eval    HPI She presents with onset worsening painful contractions yesterday (06/26/13) around 1700, reports recent history of similar contractions for past 3-4 weeks, recently with increased pelvic pressure. Occasional contractions reported every 4-5 min, describes intermittent irregular pattern.  Vaginal bleeding: none Membranes: intact, no loss / leakage of fluid Fetal activity: good FM  Pregnancy course uncomplicated with [facility] (Dr. Janae SauceLouk). Prenatal chart review shows multiple recent MAU admission for concerns of pre-term contractions.  Dating criteria: LMP confirmed by early US  Significant PMH/OB Hx   OBJECTIVE  Filed Vitals:   06/27/13 1011  BP: 117/62  Pulse: 84  Temp: 98.3 F (36.8 C)  Resp: 18    Physical exam General: well-appearing, conversational, NAD Heart: RRR, no murmurs Lungs: CTAB Abdomen: soft, NTND, gravid with age appropriate size, no significant contractions palpable Cervical exam: Fingertip dilatation, long, high, vertex  Fetal monitoring Toco: Intermittent irregular contractions FHR: baseline HR 130, moderate variability, accels present 15x15, no decels  MAU Course  ASSESSMENT  Vicki Everett is a 23 y.o. [id number] at 9638w2d who presented for increased pelvic pressure and contractions. From prior notes, suspect that prior pregnancy was actually term at 37 weeks. Additionally, has already received BMZ in February 2015. - SVE FT/thick/high - UA (unremarkable), add-on UDS (negative) False labor with pelvic pressure and UI NST performed - Category 1 FHR. No evidence of regular uterine contractions  PLAN Discharge home with labor precautions [facility], next apt is tomorrow 4/7 at 10:00am in [place]  I have seen and examined this patient  and agree with above documentation in the resident's note. Pt presented with irregualr and occasionally painful contractions over the past 12 hours.  She is a [id number] @ 6838w2d.  SVE by me FT/thick and high.  Irregular occasional contractions on TOCO. Reassurance given. Labor precautions discussed. F/u in office in the AM as scheduled.    Rulon AbideKeli Ladell Bey, M.D. [facility] 06/27/2013 12:39 PM

## 2013-06-27 NOTE — MAU Note (Signed)
[facility] [place]; fell asleep during intake interview;

## 2013-06-27 NOTE — Discharge Instructions (Signed)
Please call your OB doctor or return to MAU if any significant concerns that baby is not moving as frequently, significant loss or leakage of fluid, vaginal bleeding, or regular painful contractions.  Braxton Hicks Contractions Pregnancy is commonly associated with contractions of the uterus throughout the pregnancy. Towards the end of pregnancy (32 to 34 weeks), these contractions [facility](Braxton Willa RoughHicks) can develop more often and may become more forceful. This is not true labor because these contractions do not result in opening (dilatation) and thinning of the cervix. They are sometimes difficult to tell apart from true labor because these contractions can be forceful and people have different pain tolerances. You should not feel embarrassed if you go to the hospital with false labor. Sometimes, the only way to tell if you are in true labor is for your caregiver to follow the changes in the cervix. How to tell the difference between true and false labor:  False labor.  The contractions of false labor are usually shorter, irregular and not as hard as those of true labor.  They are often felt in the front of the lower abdomen and in the groin.  They may leave with walking around or changing positions while lying down.  They get weaker and are shorter lasting as time goes on.  These contractions are usually irregular.  They do not usually become progressively stronger, regular and closer together as with true labor.  True labor.  Contractions in true labor last 30 to 70 seconds, become very regular, usually become more intense, and increase in frequency.  They do not go away with walking.  The discomfort is usually felt in the top of the uterus and spreads to the lower abdomen and low back.  True labor can be determined by your caregiver with an exam. This will show that the cervix is dilating and getting thinner. If there are no prenatal problems or other health problems associated with the  pregnancy, it is completely safe to be sent home with false labor and await the onset of true labor. HOME CARE INSTRUCTIONS   Keep up with your usual exercises and instructions.  Take medications as directed.  Keep your regular prenatal appointment.  Eat and drink lightly if you think you are going into labor.  If BH contractions are making you uncomfortable:  Change your activity position from lying down or resting to walking/walking to resting.  Sit and rest in a tub of warm water.  Drink 2 to 3 glasses of water. Dehydration may cause B-H contractions.  Do slow and deep breathing several times an hour. SEEK IMMEDIATE MEDICAL CARE IF:   Your contractions continue to become stronger, more regular, and closer together.  You have a gushing, burst or leaking of fluid from the vagina.  An oral temperature above 102 F (38.9 C) develops.  You have passage of blood-tinged mucus.  You develop vaginal bleeding.  You develop continuous belly (abdominal) pain.  You have low back pain that you never had before.  You feel the baby's head pushing down causing pelvic pressure.  The baby is not moving as much as it used to. Document Released: 03/10/2005 Document Revised: 06/02/2011 Document Reviewed: 12/20/2012 [facility] Patient Information 2014 [place], [place].

## 2013-07-03 ENCOUNTER — Inpatient Hospital Stay (HOSPITAL_COMMUNITY)
Admission: AD | Admit: 2013-07-03 | Discharge: 2013-07-03 | Disposition: A | Discharge status: Home / Self Care | Payer: Medicaid Other | Source: Ambulatory Visit | Attending: Obstetrics & Gynecology | Admitting: Obstetrics & Gynecology

## 2013-07-03 ENCOUNTER — Encounter (HOSPITAL_COMMUNITY): Payer: Self-pay | Admitting: *Deleted

## 2013-07-03 DIAGNOSIS — O4700 False labor before 37 completed weeks of gestation, unspecified trimester: Secondary | ICD-10-CM | POA: Insufficient documentation

## 2013-07-03 DIAGNOSIS — Z87891 Personal history of nicotine dependence: Secondary | ICD-10-CM

## 2013-07-03 NOTE — MAU Note (Signed)
Pt presents with complaints of contractions that started yesterday but have gotten more regular throughout the day. Denies any vaginal bleeding, but has had some LOF.

## 2013-07-03 NOTE — MAU Provider Note (Signed)
History    [id number] @ 36 wks in in with intermittant contractions since yesterday. CSN: [id number]  Arrival date and time: 07/03/13 1654   First Provider Initiated Contact with Patient 07/03/13 1817      Chief Complaint  Patient presents with  . Labor Eval   HPI  OB History   Grav Para Term Preterm Abortions TAB SAB Ect Mult Living   5 1  1 3  3   1       Past Medical History  Diagnosis Date  . Asthma   . Vaginal Pap smear, abnormal   . Sciatica     Past Surgical History  Procedure Laterality Date  . Knee surgery  2011 & 2012    Family History  Problem Relation Age of Onset  . Alcohol abuse Neg Hx   . Arthritis Neg Hx   . Asthma Neg Hx   . Birth defects Neg Hx   . Cancer Neg Hx   . COPD Neg Hx   . Depression Neg Hx   . Diabetes Neg Hx   . Drug abuse Neg Hx   . Early death Neg Hx   . Hearing loss Neg Hx   . Hyperlipidemia Neg Hx   . Heart disease Neg Hx   . Hypertension Neg Hx   . Kidney disease Neg Hx   . Learning disabilities Neg Hx   . Mental illness Neg Hx   . Mental retardation Neg Hx   . Miscarriages / Stillbirths Neg Hx   . Stroke Neg Hx   . Vision loss Neg Hx   . Varicose Veins Neg Hx     History  Substance Use Topics  . Smoking status: Former Smoker -- 0.25 packs/day  . Smokeless tobacco: Not on file  . Alcohol Use: No    Allergies:  Allergies  Allergen Reactions  . Sulfa Antibiotics Anaphylaxis and Itching  . Sodium Chloride Hives and Itching    IV sodium chloride caused hives and itching.    Prescriptions prior to admission  Medication Sig Dispense Refill  . acetaminophen (TYLENOL) 500 MG tablet Take 1,000 mg by mouth every 6 (six) hours as needed for mild pain.       Marland Kitchen albuterol (PROVENTIL HFA;VENTOLIN HFA) 108 (90 BASE) MCG/ACT inhaler Inhale 2 puffs into the lungs 2 (two) times daily as needed for wheezing or shortness of breath.      . Budesonide-Formoterol Fumarate (SYMBICORT IN) Inhale 2 puffs into the lungs daily.      .  famotidine (PEPCID) 40 MG tablet Take 1 tablet (40 mg total) by mouth daily.  30 tablet  2  . ferrous sulfate 325 (65 FE) MG tablet Take 325 mg by mouth daily with supper.       . promethazine (PHENERGAN) 25 MG tablet Take 25 mg by mouth every 6 (six) hours as needed for nausea or vomiting.        Review of Systems  Constitutional: Negative.   HENT: Negative.   Eyes: Negative.   Respiratory: Negative.   Cardiovascular: Negative.   Gastrointestinal: Positive for abdominal pain.  Genitourinary: Negative.   Musculoskeletal: Negative.   Skin: Negative.   Neurological: Negative.   Endo/Heme/Allergies: Negative.   Psychiatric/Behavioral: Negative.    Physical Exam   Blood pressure 138/85, pulse 108, temperature 98.1 F (36.7 C), temperature source Oral, resp. rate 16, height 5\' 4"  (1.626 m), weight 194 lb (87.998 kg).  Physical Exam  Constitutional: She is oriented to person, place, and  time. She appears well-developed and well-nourished.  HENT:  Head: Normocephalic.  Eyes: Pupils are equal, round, and reactive to light.  Neck: Normal range of motion. Neck supple.  Cardiovascular: Normal rate, regular rhythm, normal heart sounds and intact distal pulses.   Respiratory: Effort normal and breath sounds normal.  GI: Soft. Bowel sounds are normal.  Genitourinary: Vagina normal and uterus normal.  Musculoskeletal: Normal range of motion.  Neurological: She is alert and oriented to person, place, and time. She has normal reflexes.  Skin: Skin is warm and dry.  Psychiatric: She has a normal mood and affect. Her behavior is normal. Judgment and thought content normal.    MAU Course  Procedures  MDM False labor  Assessment and Plan  D/c home  Ferdie PingMarie Darlene Langford Everett 07/03/2013, 6:25 PM

## 2013-07-07 ENCOUNTER — Inpatient Hospital Stay (HOSPITAL_COMMUNITY)
Admission: AD | Admit: 2013-07-07 | Discharge: 2013-07-07 | Disposition: A | Discharge status: Home / Self Care | Payer: Medicaid Other | Source: Ambulatory Visit | Attending: Obstetrics & Gynecology | Admitting: Obstetrics & Gynecology

## 2013-07-07 ENCOUNTER — Inpatient Hospital Stay (HOSPITAL_COMMUNITY): Payer: Medicaid Other

## 2013-07-07 ENCOUNTER — Encounter (HOSPITAL_COMMUNITY): Payer: Self-pay

## 2013-07-07 DIAGNOSIS — R1084 Generalized abdominal pain: Secondary | ICD-10-CM

## 2013-07-07 DIAGNOSIS — O99891 Other specified diseases and conditions complicating pregnancy: Secondary | ICD-10-CM

## 2013-07-07 DIAGNOSIS — W108XXA Fall (on) (from) other stairs and steps, initial encounter: Secondary | ICD-10-CM | POA: Insufficient documentation

## 2013-07-07 DIAGNOSIS — O4700 False labor before 37 completed weeks of gestation, unspecified trimester: Secondary | ICD-10-CM | POA: Insufficient documentation

## 2013-07-07 DIAGNOSIS — Z87891 Personal history of nicotine dependence: Secondary | ICD-10-CM | POA: Insufficient documentation

## 2013-07-07 DIAGNOSIS — W010XXA Fall on same level from slipping, tripping and stumbling without subsequent striking against object, initial encounter: Secondary | ICD-10-CM

## 2013-07-07 DIAGNOSIS — O9989 Other specified diseases and conditions complicating pregnancy, childbirth and the puerperium: Principal | ICD-10-CM

## 2013-07-07 DIAGNOSIS — Y9289 Other specified places as the place of occurrence of the external cause: Secondary | ICD-10-CM | POA: Insufficient documentation

## 2013-07-07 HISTORY — DX: Other specified postprocedural states: Z98.890

## 2013-07-07 NOTE — MAU Provider Note (Signed)
None     Chief Complaint:  No chief complaint on file.   Vicki Everett is  23 y.o. [id number] at 2270w5d presents complaining of fall at 0800. Pt presents to MAU at 1300 after falling from ground level up a set of stairs on left side of abdomen. Pt states that she has been havnig contractions for the last several days every 3-5 minutes that are minimal painful. Pt states that she has had no loss of fluid and no vaginal bleeding. Pt states good fetal movement.  Pt is seen at [place] but has to take the bus and this is the closest hospital.  Obstetrical/Gynecological History: OB History   Grav Para Term Preterm Abortions TAB SAB Ect Mult Living   5 1  1 3  3   1      Past Medical History: Past Medical History  Diagnosis Date  . Asthma   . Vaginal Pap smear, abnormal   . Sciatica     Past Surgical History: Past Surgical History  Procedure Laterality Date  . Knee surgery  2011 & 2012    Family History: Family History  Problem Relation Age of Onset  . Alcohol abuse Neg Hx   . Arthritis Neg Hx   . Asthma Neg Hx   . Birth defects Neg Hx   . Cancer Neg Hx   . COPD Neg Hx   . Depression Neg Hx   . Diabetes Neg Hx   . Drug abuse Neg Hx   . Early death Neg Hx   . Hearing loss Neg Hx   . Hyperlipidemia Neg Hx   . Heart disease Neg Hx   . Hypertension Neg Hx   . Kidney disease Neg Hx   . Learning disabilities Neg Hx   . Mental illness Neg Hx   . Mental retardation Neg Hx   . Miscarriages / Stillbirths Neg Hx   . Stroke Neg Hx   . Vision loss Neg Hx   . Varicose Veins Neg Hx     Social History: History  Substance Use Topics  . Smoking status: Former Smoker -- 0.25 packs/day  . Smokeless tobacco: Not on file  . Alcohol Use: No    Allergies:  Allergies  Allergen Reactions  . Sulfa Antibiotics Anaphylaxis and Itching  . Sodium Chloride Hives and Itching    IV sodium chloride caused hives and itching.    Meds:  Prescriptions prior to admission  Medication Sig  Dispense Refill  . acetaminophen (TYLENOL) 500 MG tablet Take 1,000 mg by mouth every 6 (six) hours as needed for mild pain.       Marland Kitchen. albuterol (PROVENTIL HFA;VENTOLIN HFA) 108 (90 BASE) MCG/ACT inhaler Inhale 2 puffs into the lungs 2 (two) times daily as needed for wheezing or shortness of breath.      . Budesonide-Formoterol Fumarate (SYMBICORT IN) Inhale 2 puffs into the lungs daily.      . famotidine (PEPCID) 40 MG tablet Take 1 tablet (40 mg total) by mouth daily.  30 tablet  2  . ferrous sulfate 325 (65 FE) MG tablet Take 325 mg by mouth daily with supper.       . promethazine (PHENERGAN) 25 MG tablet Take 25 mg by mouth every 6 (six) hours as needed for nausea or vomiting.        Review of Systems -   Review of Systems  No headachve, f/c, sob, n/v, no urinary symptoms, no bowel symptoms.  Physical Exam  There were  no vitals taken for this visit. GENERAL: Well-developed, well-nourished female in no acute distress.  LUNGS: Clear to auscultation bilaterally.  HEART: Regular rate and rhythm. ABDOMEN: Soft, nontender, nondistended, gravid.  EXTREMITIES: Nontender, no edema, 2+ distal pulses. DTR's 2+ Dilation: 3 Effacement (%): 30 Cervical Position: Posterior Station: -3 Presentation: Vertex Exam by:: Dr Ike Benedom Presentation: cephalic FHT:  Baseline rate 140s bpm   Variability moderate  Accelerations absent   Decelerations none Contractions: Every 3-10 mins   Labs: No results found for this or any previous visit (from the past 24 hour(s)). Imaging Studies:  No results found.  MDM: Low velocity fall from ground level. Will obs for 1 hr and likely discharge if no cervical change and reactive fetus. BPP for no accels on the monitor 8/8 prelimenary report.  obs for 3 hrs without cervical change or vaginal bleeding. 10hr after fall. Will discharge Assessment: Vicki Everett is  23 y.o. [id number] at 6036w5d presents s/p fall this morning.  Reassuring fetal status. No evidence of  abruption.  Discharge patient to follow up as needed with primary OB. Recommend getting her records if she is planning on delivering here.  Minta BalsamMichael R Jakeria Caissie 4/16/20153:28 PM

## 2013-07-07 NOTE — Progress Notes (Signed)
Pt discharged to front entrance with sandwich tray from LD.

## 2013-07-07 NOTE — OB Triage Note (Signed)
Pt. Came in after falling up stairs. Experiencing pain from UCs that has been ongoing. C/O of soreness on the right side where she fell. Denies leaking of fluid, or vaginal bleeding.

## 2013-07-07 NOTE — Progress Notes (Signed)
Dr. Macon LargeAnyanwu called to review the fhr tracing before discharge. Ok to discharge at this time.

## 2013-07-07 NOTE — Discharge Instructions (Signed)
Third Trimester of Pregnancy  The third trimester is from week 29 through week 42, months 7 through 9. The third trimester is a time when the fetus is growing rapidly. At the end of the ninth month, the fetus is about 20 inches in length and weighs 6 10 pounds.   BODY CHANGES  Your body goes through many changes during pregnancy. The changes vary from woman to woman.    Your weight will continue to increase. You can expect to gain 25 35 pounds (11 16 kg) by the end of the pregnancy.   You may begin to get stretch marks on your hips, abdomen, and breasts.   You may urinate more often because the fetus is moving lower into your pelvis and pressing on your bladder.   You may develop or continue to have heartburn as a result of your pregnancy.   You may develop constipation because certain hormones are causing the muscles that push waste through your intestines to slow down.   You may develop hemorrhoids or swollen, bulging veins (varicose veins).   You may have pelvic pain because of the weight gain and pregnancy hormones relaxing your joints between the bones in your pelvis. Back aches may result from over exertion of the muscles supporting your posture.   Your breasts will continue to grow and be tender. A yellow discharge may leak from your breasts called colostrum.   Your belly button may stick out.   You may feel short of breath because of your expanding uterus.   You may notice the fetus "dropping," or moving lower in your abdomen.   You may have a bloody mucus discharge. This usually occurs a few days to a week before labor begins.   Your cervix becomes thin and soft (effaced) near your due date.  WHAT TO EXPECT AT YOUR PRENATAL EXAMS   You will have prenatal exams every 2 weeks until week 36. Then, you will have weekly prenatal exams. During a routine prenatal visit:   You will be weighed to make sure you and the fetus are growing normally.   Your blood pressure is taken.   Your abdomen will be  measured to track your baby's growth.   The fetal heartbeat will be listened to.   Any test results from the previous visit will be discussed.   You may have a cervical check near your due date to see if you have effaced.  At around 36 weeks, your caregiver will check your cervix. At the same time, your caregiver will also perform a test on the secretions of the vaginal tissue. This test is to determine if a type of bacteria, Group B streptococcus, is present. Your caregiver will explain this further.  Your caregiver may ask you:   What your birth plan is.   How you are feeling.   If you are feeling the baby move.   If you have had any abnormal symptoms, such as leaking fluid, bleeding, severe headaches, or abdominal cramping.   If you have any questions.  Other tests or screenings that may be performed during your third trimester include:   Blood tests that check for low iron levels (anemia).   Fetal testing to check the health, activity level, and growth of the fetus. Testing is done if you have certain medical conditions or if there are problems during the pregnancy.  FALSE LABOR  You may feel small, irregular contractions that eventually go away. These are called Braxton Hicks contractions, or   false labor. Contractions may last for hours, days, or even weeks before true labor sets in. If contractions come at regular intervals, intensify, or become painful, it is best to be seen by your caregiver.   SIGNS OF LABOR    Menstrual-like cramps.   Contractions that are 5 minutes apart or less.   Contractions that start on the top of the uterus and spread down to the lower abdomen and back.   A sense of increased pelvic pressure or back pain.   A watery or bloody mucus discharge that comes from the vagina.  If you have any of these signs before the 37th week of pregnancy, call your caregiver right away. You need to go to the hospital to get checked immediately.  HOME CARE INSTRUCTIONS    Avoid all  smoking, herbs, alcohol, and unprescribed drugs. These chemicals affect the formation and growth of the baby.   Follow your caregiver's instructions regarding medicine use. There are medicines that are either safe or unsafe to take during pregnancy.   Exercise only as directed by your caregiver. Experiencing uterine cramps is a good sign to stop exercising.   Continue to eat regular, healthy meals.   Wear a good support bra for breast tenderness.   Do not use hot tubs, steam rooms, or saunas.   Wear your seat belt at all times when driving.   Avoid raw meat, uncooked cheese, cat litter boxes, and soil used by cats. These carry germs that can cause birth defects in the baby.   Take your prenatal vitamins.   Try taking a stool softener (if your caregiver approves) if you develop constipation. Eat more high-fiber foods, such as fresh vegetables or fruit and whole grains. Drink plenty of fluids to keep your urine clear or pale yellow.   Take warm sitz baths to soothe any pain or discomfort caused by hemorrhoids. Use hemorrhoid cream if your caregiver approves.   If you develop varicose veins, wear support hose. Elevate your feet for 15 minutes, 3 4 times a day. Limit salt in your diet.   Avoid heavy lifting, wear low heal shoes, and practice good posture.   Rest a lot with your legs elevated if you have leg cramps or low back pain.   Visit your dentist if you have not gone during your pregnancy. Use a soft toothbrush to brush your teeth and be gentle when you floss.   A sexual relationship may be continued unless your caregiver directs you otherwise.   Do not travel far distances unless it is absolutely necessary and only with the approval of your caregiver.   Take prenatal classes to understand, practice, and ask questions about the labor and delivery.   Make a trial run to the hospital.   Pack your hospital bag.   Prepare the baby's nursery.   Continue to go to all your prenatal visits as directed  by your caregiver.  SEEK MEDICAL CARE IF:   You are unsure if you are in labor or if your water has broken.   You have dizziness.   You have mild pelvic cramps, pelvic pressure, or nagging pain in your abdominal area.   You have persistent nausea, vomiting, or diarrhea.   You have a bad smelling vaginal discharge.   You have pain with urination.  SEEK IMMEDIATE MEDICAL CARE IF:    You have a fever.   You are leaking fluid from your vagina.   You have spotting or bleeding from your vagina.     You have severe abdominal cramping or pain.   You have rapid weight loss or gain.   You have shortness of breath with chest pain.   You notice sudden or extreme swelling of your face, hands, ankles, feet, or legs.   You have not felt your baby move in over an hour.   You have severe headaches that do not go away with medicine.   You have vision changes.  Document Released: 03/04/2001 Document Revised: 11/10/2012 Document Reviewed: 05/11/2012  [employer] Patient Information 2014 [employer].

## 2013-07-07 NOTE — Progress Notes (Signed)
Dr Ike Benedom notified of FHr tracing with minimal variability and lack of accels despite stimulation. Informed provider of BPP results despite unreactive NST. Provider ordered to continue with D/C order because of BPP results; NST was irrelavant at this point per provider.

## 2013-07-10 ENCOUNTER — Inpatient Hospital Stay (HOSPITAL_COMMUNITY)
Admission: AD | Admit: 2013-07-10 | Discharge: 2013-07-11 | Disposition: A | Discharge status: Home / Self Care | Payer: Medicaid Other | Source: Ambulatory Visit | Attending: Obstetrics & Gynecology | Admitting: Obstetrics & Gynecology

## 2013-07-10 ENCOUNTER — Encounter (HOSPITAL_COMMUNITY): Payer: Self-pay | Admitting: *Deleted

## 2013-07-10 DIAGNOSIS — O479 False labor, unspecified: Secondary | ICD-10-CM | POA: Insufficient documentation

## 2013-07-10 DIAGNOSIS — O361990 Maternal care for other isoimmunization, unspecified trimester, not applicable or unspecified: Secondary | ICD-10-CM

## 2013-07-10 DIAGNOSIS — O99891 Other specified diseases and conditions complicating pregnancy: Secondary | ICD-10-CM | POA: Insufficient documentation

## 2013-07-10 DIAGNOSIS — O9989 Other specified diseases and conditions complicating pregnancy, childbirth and the puerperium: Secondary | ICD-10-CM

## 2013-07-10 LAB — WET PREP, GENITAL
Clue Cells Wet Prep HPF POC: NONE SEEN
Trich, Wet Prep: NONE SEEN
YEAST WET PREP: NONE SEEN

## 2013-07-10 LAB — POCT FERN TEST: POCT Fern Test: NEGATIVE

## 2013-07-10 MED ORDER — ZOLPIDEM TARTRATE 5 MG PO TABS
5.0000 mg | ORAL_TABLET | Freq: Once | ORAL | Status: AC
  Administered 2013-07-11: 5 mg via ORAL
  Filled 2013-07-10: qty 1

## 2013-07-10 NOTE — Discharge Instructions (Signed)
Third Trimester of Pregnancy  The third trimester is from week 29 through week 42, months 7 through 9. The third trimester is a time when the fetus is growing rapidly. At the end of the ninth month, the fetus is about 20 inches in length and weighs 6 10 pounds.   BODY CHANGES  Your body goes through many changes during pregnancy. The changes vary from woman to woman.    Your weight will continue to increase. You can expect to gain 25 35 pounds (11 16 kg) by the end of the pregnancy.   You may begin to get stretch marks on your hips, abdomen, and breasts.   You may urinate more often because the fetus is moving lower into your pelvis and pressing on your bladder.   You may develop or continue to have heartburn as a result of your pregnancy.   You may develop constipation because certain hormones are causing the muscles that push waste through your intestines to slow down.   You may develop hemorrhoids or swollen, bulging veins (varicose veins).   You may have pelvic pain because of the weight gain and pregnancy hormones relaxing your joints between the bones in your pelvis. Back aches may result from over exertion of the muscles supporting your posture.   Your breasts will continue to grow and be tender. A yellow discharge may leak from your breasts called colostrum.   Your belly button may stick out.   You may feel short of breath because of your expanding uterus.   You may notice the fetus "dropping," or moving lower in your abdomen.   You may have a bloody mucus discharge. This usually occurs a few days to a week before labor begins.   Your cervix becomes thin and soft (effaced) near your due date.  WHAT TO EXPECT AT YOUR PRENATAL EXAMS   You will have prenatal exams every 2 weeks until week 36. Then, you will have weekly prenatal exams. During a routine prenatal visit:   You will be weighed to make sure you and the fetus are growing normally.   Your blood pressure is taken.   Your abdomen will be  measured to track your baby's growth.   The fetal heartbeat will be listened to.   Any test results from the previous visit will be discussed.   You may have a cervical check near your due date to see if you have effaced.  At around 36 weeks, your caregiver will check your cervix. At the same time, your caregiver will also perform a test on the secretions of the vaginal tissue. This test is to determine if a type of bacteria, Group B streptococcus, is present. Your caregiver will explain this further.  Your caregiver may ask you:   What your birth plan is.   How you are feeling.   If you are feeling the baby move.   If you have had any abnormal symptoms, such as leaking fluid, bleeding, severe headaches, or abdominal cramping.   If you have any questions.  Other tests or screenings that may be performed during your third trimester include:   Blood tests that check for low iron levels (anemia).   Fetal testing to check the health, activity level, and growth of the fetus. Testing is done if you have certain medical conditions or if there are problems during the pregnancy.  FALSE LABOR  You may feel small, irregular contractions that eventually go away. These are called Braxton Hicks contractions, or   false labor. Contractions may last for hours, days, or even weeks before true labor sets in. If contractions come at regular intervals, intensify, or become painful, it is best to be seen by your caregiver.   SIGNS OF LABOR    Menstrual-like cramps.   Contractions that are 5 minutes apart or less.   Contractions that start on the top of the uterus and spread down to the lower abdomen and back.   A sense of increased pelvic pressure or back pain.   A watery or bloody mucus discharge that comes from the vagina.  If you have any of these signs before the 37th week of pregnancy, call your caregiver right away. You need to go to the hospital to get checked immediately.  HOME CARE INSTRUCTIONS    Avoid all  smoking, herbs, alcohol, and unprescribed drugs. These chemicals affect the formation and growth of the baby.   Follow your caregiver's instructions regarding medicine use. There are medicines that are either safe or unsafe to take during pregnancy.   Exercise only as directed by your caregiver. Experiencing uterine cramps is a good sign to stop exercising.   Continue to eat regular, healthy meals.   Wear a good support bra for breast tenderness.   Do not use hot tubs, steam rooms, or saunas.   Wear your seat belt at all times when driving.   Avoid raw meat, uncooked cheese, cat litter boxes, and soil used by cats. These carry germs that can cause birth defects in the baby.   Take your prenatal vitamins.   Try taking a stool softener (if your caregiver approves) if you develop constipation. Eat more high-fiber foods, such as fresh vegetables or fruit and whole grains. Drink plenty of fluids to keep your urine clear or pale yellow.   Take warm sitz baths to soothe any pain or discomfort caused by hemorrhoids. Use hemorrhoid cream if your caregiver approves.   If you develop varicose veins, wear support hose. Elevate your feet for 15 minutes, 3 4 times a day. Limit salt in your diet.   Avoid heavy lifting, wear low heal shoes, and practice good posture.   Rest a lot with your legs elevated if you have leg cramps or low back pain.   Visit your dentist if you have not gone during your pregnancy. Use a soft toothbrush to brush your teeth and be gentle when you floss.   A sexual relationship may be continued unless your caregiver directs you otherwise.   Do not travel far distances unless it is absolutely necessary and only with the approval of your caregiver.   Take prenatal classes to understand, practice, and ask questions about the labor and delivery.   Make a trial run to the hospital.   Pack your hospital bag.   Prepare the baby's nursery.   Continue to go to all your prenatal visits as directed  by your caregiver.  SEEK MEDICAL CARE IF:   You are unsure if you are in labor or if your water has broken.   You have dizziness.   You have mild pelvic cramps, pelvic pressure, or nagging pain in your abdominal area.   You have persistent nausea, vomiting, or diarrhea.   You have a bad smelling vaginal discharge.   You have pain with urination.  SEEK IMMEDIATE MEDICAL CARE IF:    You have a fever.   You are leaking fluid from your vagina.   You have spotting or bleeding from your vagina.     You have severe abdominal cramping or pain.   You have rapid weight loss or gain.   You have shortness of breath with chest pain.   You notice sudden or extreme swelling of your face, hands, ankles, feet, or legs.   You have not felt your baby move in over an hour.   You have severe headaches that do not go away with medicine.   You have vision changes.  Document Released: 03/04/2001 Document Revised: 11/10/2012 Document Reviewed: 05/11/2012  [employer] Patient Information 2014 [employer].

## 2013-07-10 NOTE — MAU Note (Addendum)
Pt reports LOF since 2000. She was at her baby shower. When she stood up, someone noticed that the back of her pants were wet. Pt has had no further leaking since that time. Pt also states that she had had contractions for days and hasn't been able to sleep

## 2013-07-11 NOTE — MAU Provider Note (Signed)
Attestation of Attending Supervision of Obstetric Fellow: Evaluation and management procedures were performed by the Obstetric Fellow under my supervision and collaboration.  I have reviewed the Obstetric Fellow's note and chart, and I agree with the management and plan.  Elana Jian, MD, FACOG Attending Obstetrician & Gynecologist Faculty Practice, [facility]   

## 2013-07-15 ENCOUNTER — Inpatient Hospital Stay (HOSPITAL_COMMUNITY)
Admission: AD | Admit: 2013-07-15 | Discharge: 2013-07-15 | Disposition: A | Discharge status: Home / Self Care | Payer: Medicaid Other | Source: Ambulatory Visit | Attending: Obstetrics & Gynecology | Admitting: Obstetrics & Gynecology

## 2013-07-15 ENCOUNTER — Encounter (HOSPITAL_COMMUNITY): Payer: Self-pay | Admitting: *Deleted

## 2013-07-15 DIAGNOSIS — O479 False labor, unspecified: Secondary | ICD-10-CM | POA: Insufficient documentation

## 2013-07-15 LAB — GROUP B STREP BY PCR: GROUP B STREP BY PCR: NEGATIVE

## 2013-07-15 LAB — OB RESULTS CONSOLE GBS: GBS: NEGATIVE

## 2013-07-15 NOTE — MAU Note (Signed)
Contractions started at 0230, getting closer. No bleeding or leaking.  Was 3 cm when last checked. [place] [facility] [place] [facility] [place] [place], doesn't have a way to get there. Has not had GBS done yet.

## 2013-07-15 NOTE — Discharge Instructions (Signed)
Fetal Movement Counts Patient Name: __________________________________________________ Patient Due Date: ____________________ Performing a fetal movement count is highly recommended in high-risk pregnancies, but it is good for every pregnant woman to do. Your caregiver may ask you to start counting fetal movements at 28 weeks of the pregnancy. Fetal movements often increase:  After eating a full meal.  After physical activity.  After eating or drinking something sweet or cold.  At rest. Pay attention to when you feel the baby is most active. This will help you notice a pattern of your baby's sleep and wake cycles and what factors contribute to an increase in fetal movement. It is important to perform a fetal movement count at the same time each day when your baby is normally most active.  HOW TO COUNT FETAL MOVEMENTS 1. Find a quiet and comfortable area to sit or lie down on your left side. Lying on your left side provides the best blood and oxygen circulation to your baby. 2. Write down the day and time on a sheet of paper or in a journal. 3. Start counting kicks, flutters, swishes, rolls, or jabs in a 2 hour period. You should feel at least 10 movements within 2 hours. 4. If you do not feel 10 movements in 2 hours, wait 2 3 hours and count again. Look for a change in the pattern or not enough counts in 2 hours. SEEK MEDICAL CARE IF:  You feel less than 10 counts in 2 hours, tried twice.  There is no movement in over an hour.  The pattern is changing or taking longer each day to reach 10 counts in 2 hours.  You feel the baby is not moving as he or she usually does. Date: ____________ Movements: ____________ Start time: ____________ Finish time: ____________  Date: ____________ Movements: ____________ Start time: ____________ Finish time: ____________ Date: ____________ Movements: ____________ Start time: ____________ Finish time: ____________ Date: ____________ Movements: ____________  Start time: ____________ Finish time: ____________ Date: ____________ Movements: ____________ Start time: ____________ Finish time: ____________ Date: ____________ Movements: ____________ Start time: ____________ Finish time: ____________ Date: ____________ Movements: ____________ Start time: ____________ Finish time: ____________ Date: ____________ Movements: ____________ Start time: ____________ Finish time: ____________  Date: ____________ Movements: ____________ Start time: ____________ Finish time: ____________ Date: ____________ Movements: ____________ Start time: ____________ Finish time: ____________ Date: ____________ Movements: ____________ Start time: ____________ Finish time: ____________ Date: ____________ Movements: ____________ Start time: ____________ Finish time: ____________ Date: ____________ Movements: ____________ Start time: ____________ Finish time: ____________ Date: ____________ Movements: ____________ Start time: ____________ Finish time: ____________ Date: ____________ Movements: ____________ Start time: ____________ Finish time: ____________  Date: ____________ Movements: ____________ Start time: ____________ Finish time: ____________ Date: ____________ Movements: ____________ Start time: ____________ Finish time: ____________ Date: ____________ Movements: ____________ Start time: ____________ Finish time: ____________ Date: ____________ Movements: ____________ Start time: ____________ Finish time: ____________ Date: ____________ Movements: ____________ Start time: ____________ Finish time: ____________ Date: ____________ Movements: ____________ Start time: ____________ Finish time: ____________ Date: ____________ Movements: ____________ Start time: ____________ Finish time: ____________  Date: ____________ Movements: ____________ Start time: ____________ Finish time: ____________ Date: ____________ Movements: ____________ Start time: ____________ Finish time:  ____________ Date: ____________ Movements: ____________ Start time: ____________ Finish time: ____________ Date: ____________ Movements: ____________ Start time: ____________ Finish time: ____________ Date: ____________ Movements: ____________ Start time: ____________ Finish time: ____________ Date: ____________ Movements: ____________ Start time: ____________ Finish time: ____________ Date: ____________ Movements: ____________ Start time: ____________ Finish time: ____________  Date: ____________ Movements: ____________ Start time: ____________ Finish   time: ____________ Date: ____________ Movements: ____________ Start time: ____________ Finish time: ____________ Date: ____________ Movements: ____________ Start time: ____________ Finish time: ____________ Date: ____________ Movements: ____________ Start time: ____________ Finish time: ____________ Date: ____________ Movements: ____________ Start time: ____________ Finish time: ____________ Date: ____________ Movements: ____________ Start time: ____________ Finish time: ____________ Date: ____________ Movements: ____________ Start time: ____________ Finish time: ____________  Date: ____________ Movements: ____________ Start time: ____________ Finish time: ____________ Date: ____________ Movements: ____________ Start time: ____________ Finish time: ____________ Date: ____________ Movements: ____________ Start time: ____________ Finish time: ____________ Date: ____________ Movements: ____________ Start time: ____________ Finish time: ____________ Date: ____________ Movements: ____________ Start time: ____________ Finish time: ____________ Date: ____________ Movements: ____________ Start time: ____________ Finish time: ____________ Date: ____________ Movements: ____________ Start time: ____________ Finish time: ____________  Date: ____________ Movements: ____________ Start time: ____________ Finish time: ____________ Date: ____________ Movements:  ____________ Start time: ____________ Finish time: ____________ Date: ____________ Movements: ____________ Start time: ____________ Finish time: ____________ Date: ____________ Movements: ____________ Start time: ____________ Finish time: ____________ Date: ____________ Movements: ____________ Start time: ____________ Finish time: ____________ Date: ____________ Movements: ____________ Start time: ____________ Finish time: ____________ Date: ____________ Movements: ____________ Start time: ____________ Finish time: ____________  Date: ____________ Movements: ____________ Start time: ____________ Finish time: ____________ Date: ____________ Movements: ____________ Start time: ____________ Finish time: ____________ Date: ____________ Movements: ____________ Start time: ____________ Finish time: ____________ Date: ____________ Movements: ____________ Start time: ____________ Finish time: ____________ Date: ____________ Movements: ____________ Start time: ____________ Finish time: ____________ Date: ____________ Movements: ____________ Start time: ____________ Finish time: ____________ Document Released: 04/09/2006 Document Revised: 02/25/2012 Document Reviewed: 01/05/2012 [employer] Patient Information 2014 [employer].  

## 2014-01-23 ENCOUNTER — Encounter (HOSPITAL_COMMUNITY): Payer: Self-pay | Admitting: *Deleted

## 2014-03-10 ENCOUNTER — Encounter (HOSPITAL_COMMUNITY): Payer: Self-pay | Admitting: *Deleted

## 2014-04-07 ENCOUNTER — Encounter (HOSPITAL_COMMUNITY): Payer: Self-pay | Admitting: Emergency Medicine

## 2014-04-07 ENCOUNTER — Emergency Department (HOSPITAL_COMMUNITY)
Admission: EM | Admit: 2014-04-07 | Discharge: 2014-04-07 | Disposition: A | Discharge status: Home / Self Care | Payer: Medicaid Other | Attending: Emergency Medicine | Admitting: Emergency Medicine

## 2014-04-07 DIAGNOSIS — H9209 Otalgia, unspecified ear: Secondary | ICD-10-CM | POA: Insufficient documentation

## 2014-04-07 DIAGNOSIS — Z720 Tobacco use: Secondary | ICD-10-CM | POA: Insufficient documentation

## 2014-04-07 DIAGNOSIS — J45901 Unspecified asthma with (acute) exacerbation: Secondary | ICD-10-CM | POA: Insufficient documentation

## 2014-04-07 DIAGNOSIS — R112 Nausea with vomiting, unspecified: Secondary | ICD-10-CM | POA: Insufficient documentation

## 2014-04-07 DIAGNOSIS — J029 Acute pharyngitis, unspecified: Secondary | ICD-10-CM | POA: Diagnosis not present

## 2014-04-07 DIAGNOSIS — Z79899 Other long term (current) drug therapy: Secondary | ICD-10-CM | POA: Diagnosis not present

## 2014-04-07 DIAGNOSIS — R1084 Generalized abdominal pain: Secondary | ICD-10-CM | POA: Diagnosis not present

## 2014-04-07 DIAGNOSIS — Z7951 Long term (current) use of inhaled steroids: Secondary | ICD-10-CM | POA: Diagnosis not present

## 2014-04-07 LAB — RAPID STREP SCREEN (MED CTR MEBANE ONLY): Streptococcus, Group A Screen (Direct): NEGATIVE

## 2014-04-07 MED ORDER — IBUPROFEN 800 MG PO TABS: 800.0000 mg | ORAL_TABLET | Freq: Three times a day (TID) | ORAL | Status: AC

## 2014-04-07 MED ORDER — LACTATED RINGERS IV BOLUS (SEPSIS)
1000.0000 mL | Freq: Once | INTRAVENOUS | Status: AC
  Administered 2014-04-07: 1000 mL via INTRAVENOUS

## 2014-04-07 MED ORDER — DEXAMETHASONE SODIUM PHOSPHATE 10 MG/ML IJ SOLN
10.0000 mg | Freq: Once | INTRAMUSCULAR | Status: AC
  Administered 2014-04-07: 10 mg via INTRAVENOUS
  Filled 2014-04-07: qty 1

## 2014-04-07 MED ORDER — LIDOCAINE VISCOUS 2 % MT SOLN
15.0000 mL | Freq: Once | OROMUCOSAL | Status: AC
  Administered 2014-04-07: 15 mL via OROMUCOSAL
  Filled 2014-04-07: qty 15

## 2014-04-07 MED ORDER — FENTANYL CITRATE 0.05 MG/ML IJ SOLN: 50.0000 ug | Freq: Once | INTRAMUSCULAR | Status: DC

## 2014-04-07 MED ORDER — FENTANYL CITRATE 0.05 MG/ML IJ SOLN
50.0000 ug | Freq: Once | INTRAMUSCULAR | Status: AC
  Administered 2014-04-07: 50 ug via INTRAVENOUS
  Filled 2014-04-07: qty 2

## 2014-04-07 MED ORDER — LIDOCAINE VISCOUS 2 % MT SOLN: 20.0000 mL | OROMUCOSAL | Status: AC | PRN

## 2014-04-07 MED ORDER — KETOROLAC TROMETHAMINE 30 MG/ML IJ SOLN
30.0000 mg | Freq: Once | INTRAMUSCULAR | Status: AC
Start: 2014-04-07 — End: 2014-04-07
  Administered 2014-04-07: 30 mg via INTRAVENOUS
  Filled 2014-04-07: qty 1

## 2014-04-07 NOTE — ED Provider Notes (Signed)
Patient diagnosed with viral pharyngitis and given fluids, Toradol, steroids. Patient is sign out from PublixCourtney Forcucci PA-C. Patient to be reevaluated after 20-30 minutes and IV fluids are finished. And for discharge home.  Physical Exam  BP 117/60 mmHg  Pulse 96  Temp(Src) 99.3 F (37.4 C) (Oral)  Resp 18  Ht 5\' 6"  (1.676 m)  Wt 175 lb (79.379 kg)  BMI 28.26 kg/m2  SpO2 100%  Physical Exam  Constitutional: She appears well-developed and well-nourished. No distress.  HENT:  Head: Normocephalic and atraumatic.  Mouth/Throat: Oropharyngeal exudate present.  No trismus or drooling no uvula deviation.  Eyes: Conjunctivae are normal. Right eye exhibits no discharge. Left eye exhibits no discharge.  Neck: Normal range of motion.  Pulmonary/Chest: Effort normal. No respiratory distress.  Lymphadenopathy:    She has cervical adenopathy.  Neurological: She is alert. Coordination normal.  Skin: She is not diaphoretic.  Psychiatric: She has a normal mood and affect. Her behavior is normal.  Nursing note and vitals reviewed.   ED Course  Procedures  MDM  1. Pharyngitis     Patient with pharyngitis. She is tolerating fluids in the ED. Patient is afebrile now, nontoxic, and in no acute distress. Patient is appropriate for outpatient management and is stable for discharge.  Discussed return precautions with patient. Discussed all results and patient verbalizes understanding and agrees with plan.  This is a shared patient. This patient was discussed with the physician, Dr. Gwendolyn GrantWalden who saw and evaluated the patient and agrees with the plan.      Louann SjogrenVictoria L Zafira Munos, PA-C 04/08/14 0124  Elwin MochaBlair Walden, MD 04/08/14 [phone]

## 2014-04-07 NOTE — Discharge Instructions (Signed)

## 2014-04-07 NOTE — ED Notes (Signed)
Patient states sore throat x 2 days.   Patient states has had N/V.   Patient states chills and overall body aches.

## 2014-04-07 NOTE — ED Notes (Signed)
Gave pt viscous lidocaine, but she spit most of it back out into emesis bag.

## 2014-04-07 NOTE — ED Provider Notes (Signed)
CSN: [id number]     Arrival date & time 04/07/14  1434 History  This chart was scribed for non-physician practitioner, Eben Burowourtney A Forcucci, PA-C, working with Juliet RudeNathan R. Rubin PayorPickering, MD by Charline BillsEssence Howell, ED Scribe. This patient was seen in room TR05C/TR05C and the patient's care was started at 3:17 PM.   Chief Complaint  Patient presents with  . Sore Throat   The history is provided by the patient. No language interpreter was used.   HPI Comments: Scherrie NovemberCarmella Lippmann is a 24 y.o. female, with a h/o asthma, who presents to the Emergency Department complaining of gradually worsening sore throat onset yesterday. Pain is exacerbated with swallowing. Pt reports associated chills, generalized body aches, ear pain, fever, SOB, nausea, vomiting. Pt reports 4 episodes of emesis. She denies congestion, rhinorrhea, sinus pressure, cough, abdominal pain, dysuria, urinary frequency, urinary urgency, diarrhea, constipation. Pt has tried OTC cold medications and drinking warm tea and water without relief. No sick contacts. Allergies to sulfa abx and Sodium Chloride.   No PCP  Past Medical History  Diagnosis Date  . Asthma   . Vaginal Pap smear, abnormal   . Sciatica   . H/O colposcopy with cervical biopsy 04/2013   Past Surgical History  Procedure Laterality Date  . Knee surgery  2011 & 2012   Family History  Problem Relation Age of Onset  . Alcohol abuse Neg Hx   . Arthritis Neg Hx   . Asthma Neg Hx   . Birth defects Neg Hx   . Cancer Neg Hx   . COPD Neg Hx   . Depression Neg Hx   . Diabetes Neg Hx   . Drug abuse Neg Hx   . Early death Neg Hx   . Hearing loss Neg Hx   . Hyperlipidemia Neg Hx   . Heart disease Neg Hx   . Hypertension Neg Hx   . Kidney disease Neg Hx   . Learning disabilities Neg Hx   . Mental illness Neg Hx   . Mental retardation Neg Hx   . Miscarriages / Stillbirths Neg Hx   . Stroke Neg Hx   . Vision loss Neg Hx   . Varicose Veins Neg Hx    History  Substance Use Topics   . Smoking status: Current Every Day Smoker -- 0.50 packs/day    Types: Cigarettes  . Smokeless tobacco: Not on file  . Alcohol Use: Yes   OB History    Gravida Para Term Preterm AB TAB SAB Ectopic Multiple Living   5 1  1 3  3   1      Review of Systems  Constitutional: Positive for fever and chills.  HENT: Positive for ear pain and sore throat. Negative for congestion, rhinorrhea and sinus pressure.   Respiratory: Positive for shortness of breath. Negative for cough.   Gastrointestinal: Positive for nausea and vomiting. Negative for abdominal pain, diarrhea and constipation.  Genitourinary: Negative for dysuria, urgency and frequency.  Musculoskeletal: Positive for myalgias.  All other systems reviewed and are negative.  Allergies  Sulfa antibiotics and Sodium chloride  Home Medications   Prior to Admission medications   Medication Sig Start Date End Date Taking? Authorizing Provider  acetaminophen (TYLENOL) 500 MG tablet Take 1,000 mg by mouth every 6 (six) hours as needed for mild pain.     Historical Provider, MD  albuterol (PROVENTIL HFA;VENTOLIN HFA) 108 (90 BASE) MCG/ACT inhaler Inhale 2 puffs into the lungs 2 (two) times daily as needed for wheezing  or shortness of breath.    Historical Provider, MD  Budesonide-Formoterol Fumarate (SYMBICORT IN) Inhale 2 puffs into the lungs daily.    Historical Provider, MD  famotidine (PEPCID) 40 MG tablet Take 1 tablet (40 mg total) by mouth daily. 05/11/13   Adam Phenix, MD  ferrous sulfate 325 (65 FE) MG tablet Take 325 mg by mouth daily with supper.     Historical Provider, MD  ibuprofen (ADVIL,MOTRIN) 800 MG tablet Take 1 tablet (800 mg total) by mouth 3 (three) times daily. 04/07/14   Elgie Maziarz A Forcucci, PA-C  lidocaine (XYLOCAINE) 2 % solution Use as directed 20 mLs in the mouth or throat as needed for mouth pain. 04/07/14   Torian Thoennes A Forcucci, PA-C  promethazine (PHENERGAN) 25 MG tablet Take 25 mg by mouth every 6 (six) hours as  needed for nausea or vomiting.    Historical Provider, MD   Triage Vitals: 108/88 mmHg  Pulse 113  Temp(Src) 99.9 F (37.7 C) (Oral)  Resp 20  Ht  (1.676 m)  Wt 175 lb (79.379 kg)  BMI 28.26 kg/m2  SpO2 100% Physical Exam  Constitutional: She is oriented to person, place, and time. She appears well-developed and well-nourished. No distress.  HENT:  Head: Normocephalic and atraumatic.  Right Ear: External ear normal.  Left Ear: External ear normal.  Nose: Nose normal.  Mouth/Throat: Uvula is midline. Oropharyngeal exudate, posterior oropharyngeal edema and posterior oropharyngeal erythema present.  2+ tonsillar edema. Midline uvula rise.   Eyes: Conjunctivae are normal. Pupils are equal, round, and reactive to light.  Neck: Neck supple. Muscular tenderness present. No rigidity. No tracheal deviation present. No Brudzinski's sign and no Kernig's sign noted. No thyromegaly present.  Cardiovascular: Normal rate, regular rhythm, normal heart sounds and intact distal pulses.  Exam reveals no gallop and no friction rub.   No murmur heard. Pulmonary/Chest: Effort normal and breath sounds normal. No respiratory distress. She has no wheezes. She has no rales. She exhibits no tenderness.  Abdominal: Soft. Normal appearance and bowel sounds are normal. She exhibits no distension and no mass. There is generalized tenderness. There is no rigidity, no rebound, no guarding, no tenderness at McBurney's point and negative Murphy's sign.  Musculoskeletal: Normal range of motion.  Lymphadenopathy:    She has no cervical adenopathy.  Neurological: She is alert and oriented to person, place, and time.  Skin: Skin is warm and dry.  Psychiatric: She has a normal mood and affect. Her behavior is normal. Judgment and thought content normal.  Nursing note and vitals reviewed.  ED Course  Procedures (including critical care time) DIAGNOSTIC STUDIES: Oxygen Saturation is 100% on RA, normal by my  interpretation.    COORDINATION OF CARE: 3:25 PM-Discussed treatment plan which includes strep screen and injection with pt at bedside and pt agreed to plan.   Labs Review Labs Reviewed  RAPID STREP SCREEN  CULTURE, GROUP A STREP    Imaging Review No results found.   EKG Interpretation None      MDM   Final diagnoses:  Pharyngitis   Patient is a 24 year old female who presents emergency room for evaluation of sore throat 2 days. Physical exam reveals no evidence for peritonsillar abscess at this time. There is some oropharyngeal exudate. Patient also having significant myalgias. Patient was initially tachycardic on arrival. Patient given viscous lidocaine to help with throat pain which she spat most of it out.  Given tachycardia patient given lactated Ringer bolus. Patient also given Toradol  to help with myalgias and aching. Patient to be reexamined in 20-30 mins.    Patient reexamined in 30 minutes with no evidence for Kernigs or Brudzinskis sign.  Throat examination does not have any evidence for peritonsillar abscess at this time.  Patient seen by and discussed with Dr. Gwendolyn Grant who does not feel that this is meningeal or requires further imaging.  Differential includes viral pharyngitis and mononucleosis.  Rapid strep is negative.  Patient given fentanyl, decadron, and toradol.  Patient to be discharged home with viscous lidocaine and ibuprofen.  Patient signed out to [place] PA-C who will check on the patient after fluids finish.  Patient to follow-up with PCP or [facility] and wellness center.  Patient to return for difficulty swallowing, trismus, drooling, or any other concerning symptoms.  Patient states understanding and agreement.    I personally performed the services described in this documentation, which was scribed in my presence. The recorded information has been reviewed and is accurate.    Eben Burow, PA-C 04/07/14 1654  Elwin Mocha,  MD 04/08/14 [phone]

## 2014-04-10 LAB — CULTURE, GROUP A STREP

## 2014-07-08 ENCOUNTER — Emergency Department (HOSPITAL_COMMUNITY)
Admission: EM | Admit: 2014-07-08 | Discharge: 2014-07-08 | Disposition: A | Discharge status: Home / Self Care | Payer: Medicaid Other | Attending: Emergency Medicine | Admitting: Emergency Medicine

## 2014-07-08 ENCOUNTER — Encounter (HOSPITAL_COMMUNITY): Payer: Self-pay | Admitting: *Deleted

## 2014-07-08 DIAGNOSIS — F17210 Nicotine dependence, cigarettes, uncomplicated: Secondary | ICD-10-CM | POA: Diagnosis not present

## 2014-07-08 DIAGNOSIS — Z7951 Long term (current) use of inhaled steroids: Secondary | ICD-10-CM | POA: Insufficient documentation

## 2014-07-08 DIAGNOSIS — Z3A00 Weeks of gestation of pregnancy not specified: Secondary | ICD-10-CM | POA: Insufficient documentation

## 2014-07-08 DIAGNOSIS — O210 Mild hyperemesis gravidarum: Secondary | ICD-10-CM | POA: Insufficient documentation

## 2014-07-08 DIAGNOSIS — Z3490 Encounter for supervision of normal pregnancy, unspecified, unspecified trimester: Secondary | ICD-10-CM

## 2014-07-08 DIAGNOSIS — J45909 Unspecified asthma, uncomplicated: Secondary | ICD-10-CM | POA: Insufficient documentation

## 2014-07-08 DIAGNOSIS — O9989 Other specified diseases and conditions complicating pregnancy, childbirth and the puerperium: Secondary | ICD-10-CM | POA: Insufficient documentation

## 2014-07-08 DIAGNOSIS — O99331 Smoking (tobacco) complicating pregnancy, first trimester: Secondary | ICD-10-CM | POA: Diagnosis not present

## 2014-07-08 DIAGNOSIS — Z79899 Other long term (current) drug therapy: Secondary | ICD-10-CM | POA: Insufficient documentation

## 2014-07-08 DIAGNOSIS — O99511 Diseases of the respiratory system complicating pregnancy, first trimester: Secondary | ICD-10-CM | POA: Insufficient documentation

## 2014-07-08 LAB — URINALYSIS, ROUTINE W REFLEX MICROSCOPIC
Bilirubin Urine: NEGATIVE
Glucose, UA: NEGATIVE mg/dL
KETONES UR: NEGATIVE mg/dL
Nitrite: NEGATIVE
Protein, ur: NEGATIVE mg/dL
Specific Gravity, Urine: 1.029 (ref 1.005–1.030)
UROBILINOGEN UA: 4 mg/dL — AB (ref 0.0–1.0)
pH: 7.5 (ref 5.0–8.0)

## 2014-07-08 LAB — COMPREHENSIVE METABOLIC PANEL
ALT: 10 U/L (ref 0–35)
AST: 16 U/L (ref 0–37)
Albumin: 3.4 g/dL — ABNORMAL LOW (ref 3.5–5.2)
Alkaline Phosphatase: 58 U/L (ref 39–117)
Anion gap: 11 (ref 5–15)
BUN: 7 mg/dL (ref 6–23)
CHLORIDE: 103 mmol/L (ref 96–112)
CO2: 23 mmol/L (ref 19–32)
CREATININE: 0.66 mg/dL (ref 0.50–1.10)
Calcium: 9 mg/dL (ref 8.4–10.5)
GFR calc non Af Amer: >90 mL/min (ref >90)
Glucose, Bld: 90 mg/dL (ref 70–99)
Potassium: 3.3 mmol/L — ABNORMAL LOW (ref 3.5–5.1)
SODIUM: 137 mmol/L (ref 135–145)
TOTAL PROTEIN: 6.5 g/dL (ref 6.0–8.3)
Total Bilirubin: 0.6 mg/dL (ref 0.3–1.2)

## 2014-07-08 LAB — CBC WITH DIFFERENTIAL/PLATELET
BASOS ABS: 0 10*3/uL (ref 0.0–0.1)
Basophils Relative: 0 % (ref 0–1)
Eosinophils Absolute: 0 10*3/uL (ref 0.0–0.7)
Eosinophils Relative: 0 % (ref 0–5)
HEMATOCRIT: 33.8 % — AB (ref 36.0–46.0)
Hemoglobin: 11.9 g/dL — ABNORMAL LOW (ref 12.0–15.0)
Lymphocytes Relative: 34 % (ref 12–46)
Lymphs Abs: 1.7 10*3/uL (ref 0.7–4.0)
MCH: 29 pg (ref 26.0–34.0)
MCHC: 35.2 g/dL (ref 30.0–36.0)
MCV: 82.4 fL (ref 78.0–100.0)
MONO ABS: 0.4 10*3/uL (ref 0.1–1.0)
Monocytes Relative: 9 % (ref 3–12)
NEUTROS PCT: 57 % (ref 43–77)
Neutro Abs: 2.7 10*3/uL (ref 1.7–7.7)
PLATELETS: 230 10*3/uL (ref 150–400)
RBC: 4.1 MIL/uL (ref 3.87–5.11)
RDW: 12.3 % (ref 11.5–15.5)
WBC: 4.9 10*3/uL (ref 4.0–10.5)

## 2014-07-08 LAB — URINE MICROSCOPIC-ADD ON

## 2014-07-08 LAB — LIPASE, BLOOD: Lipase: 37 U/L (ref 11–59)

## 2014-07-08 LAB — POC URINE PREG, ED: Preg Test, Ur: POSITIVE — AB

## 2014-07-08 MED ORDER — ONDANSETRON 4 MG PO TBDP
8.0000 mg | ORAL_TABLET | Freq: Once | ORAL | Status: AC
  Administered 2014-07-08: 8 mg via ORAL
  Filled 2014-07-08: qty 2

## 2014-07-08 MED ORDER — ONDANSETRON HCL 8 MG PO TABS: 8.0000 mg | ORAL_TABLET | Freq: Three times a day (TID) | ORAL | Status: AC | PRN

## 2014-07-08 MED ORDER — IBUPROFEN 200 MG PO TABS
400.0000 mg | ORAL_TABLET | Freq: Once | ORAL | Status: AC
  Administered 2014-07-08: 400 mg via ORAL
  Filled 2014-07-08: qty 2

## 2014-07-08 NOTE — Discharge Instructions (Signed)
Follow up with an obstetrician as soon as possible to initiate pregnancy care.   Morning Sickness Morning sickness is when you feel sick to your stomach (nauseous) during pregnancy. This nauseous feeling may or may not come with vomiting. It often occurs in the morning but can be a problem any time of day. Morning sickness is most common during the first trimester, but it may continue throughout pregnancy. While morning sickness is unpleasant, it is usually harmless unless you develop severe and continual vomiting (hyperemesis gravidarum). This condition requires more intense treatment.  CAUSES  The cause of morning sickness is not completely known but seems to be related to normal hormonal changes that occur in pregnancy. RISK FACTORS You are at greater risk if you:  Experienced nausea or vomiting before your pregnancy.  Had morning sickness during a previous pregnancy.  Are pregnant with more than one baby, such as twins. TREATMENT  Do not use any medicines (prescription, over-the-counter, or herbal) for morning sickness without first talking to your health care provider. Your health care provider may prescribe or recommend:  Vitamin B6 supplements.  Anti-nausea medicines.  The herbal medicine ginger. HOME CARE INSTRUCTIONS   Only take over-the-counter or prescription medicines as directed by your health care provider.  Taking multivitamins before getting pregnant can prevent or decrease the severity of morning sickness in most women.  Eat a piece of dry toast or unsalted crackers before getting out of bed in the morning.  Eat five or six small meals a day.  Eat dry and bland foods (rice, baked potato). Foods high in carbohydrates are often helpful.  Do not drink liquids with your meals. Drink liquids between meals.  Avoid greasy, fatty, and spicy foods.  Get someone to cook for you if the smell of any food causes nausea and vomiting.  If you feel nauseous after taking  prenatal vitamins, take the vitamins at night or with a snack.  Snack on protein foods (nuts, yogurt, cheese) between meals if you are hungry.  Eat unsweetened gelatins for desserts.  Wearing an acupressure wristband (worn for sea sickness) may be helpful.  Acupuncture may be helpful.  Do not smoke.  Get a humidifier to keep the air in your house free of odors.  Get plenty of fresh air. SEEK MEDICAL CARE IF:   Your home remedies are not working, and you need medicine.  You feel dizzy or lightheaded.  You are losing weight. SEEK IMMEDIATE MEDICAL CARE IF:   You have persistent and uncontrolled nausea and vomiting.  You pass out (faint). MAKE SURE YOU:  Understand these instructions.  Will watch your condition.  Will get help right away if you are not doing well or get worse. Document Released: 05/01/2006 Document Revised: 03/15/2013 Document Reviewed: 08/25/2012 [facility] Patient Information 2015 [employer] [place]. This information is not intended to replace advice given to you by your health care provider. Make sure you discuss any questions you have with your health care provider.     Emergency Department Resource Guide 1) Find a Doctor and Pay Out of Pocket Although you won't have to find out who is covered by your insurance plan, it is a good idea to ask around and get recommendations. You will then need to call the office and see if the doctor you have chosen will accept you as a new patient and what types of options they offer for patients who are self-pay. Some doctors offer discounts or will set up payment plans for their patients  who do not have insurance, but you will need to ask so you aren't surprised when you get to your appointment.  2) Contact Your Local Health Department Not all health departments have doctors that can see patients for sick visits, but many do, so it is worth a call to see if yours does. If you don't know where your local health department  is, you can check in your phone book. The CDC also has a tool to help you locate your state's health department, and many state websites also have listings of all of their local health departments.  3) Find a Walk-in Clinic If your illness is not likely to be very severe or complicated, you may want to try a walk in clinic. These are popping up all over the country in pharmacies, drugstores, and shopping centers. They're usually staffed by nurse practitioners or physician assistants that have been trained to treat common illnesses and complaints. They're usually fairly quick and inexpensive. However, if you have serious medical issues or chronic medical problems, these are probably not your best option.  No Primary Care Doctor: - Call Health Connect at  [phone] - they can help you locate a primary care doctor that  accepts your insurance, provides certain services, etc. - Physician Referral Service- [phone]  Chronic Pain Problems: Organization         Address  Phone   Notes  [facility]  [phone] Patients need to be referred by their primary care doctor.   Medication Assistance: Organization         Address  Phone   Notes  [facility] Medication [facility] [address] [place], [place] [address] [phone] --Must be a resident of [facility] -- Must have NO insurance coverage whatsoever (no Medicaid/ Medicare, etc.) -- The pt. MUST have a primary care doctor that directs their care regularly and follows them in the community   [facility]  [phone]   [employer]  [phone]    Agencies that provide inexpensive medical care: Organization         Address  Phone   Notes  [facility]  [phone]   [facility]    [phone]   [facility] [address] [place], [place] [address] [phone]   [facility] [address], [place]  [phone]   Planned Parenthood    [phone]   [facility]    [phone]   [facility]  [address], [place] Phone:  [phone], Fax:  [phone] Hours of Operation:  9 am - 6 pm, M-F.  Also accepts Medicaid/Medicare and self-pay.  [facility]  [address], [place] Phone: [phone], Fax: [phone]. Hours of Operation:  8:30 am - 5:30 pm, M-F.  Also accepts Medicaid and self-pay.  [facility] [place] [address] [place] Phone: [phone]   [facility] [address] Natasha Bence [place], [place] [phone], Ext. [phone] Mondays & Thursdays: 7-9 AM.  First 15 patients are seen on a first come, first serve basis.    Medicaid-accepting [facility] Providers:  Organization         Address  Phone   Notes  [facility] [address], [place] [phone] Also accepts self-pay patients.  [facility] [address] Laurell Josephs [place], [place]  [phone]   [facility] [address], [place] [phone]   [facility] [address] [place] [phone]   Renaye Rakers [address], [place]   [phone] Only accepts [place] Access [place] patients after they have their name applied to their card.   Self-Pay (no insurance) in [facility]:  Organization         Address  Phone   Notes  Sickle Cell Patients, [facility] [address] [place], [place] [phone]   [facility] [address] [place], [place] [phone]   [facility] [place]  [address], [place] [phone]   [facility]/Dr. Osei-Bonsu  [address] [place] or [address], [place] [phone] Phone number for both [place] and [place]  locations is the same.  [facility] [address] [place] [phone]   [facility] [address], [place] or [address] [phone]   [facility] [address] [place] [phone], phone; [phone], fax Sees patients 1st and 3rd Saturday of every month.  Must not qualify for public or private insurance (i.e. Medicaid, Medicare, [place] Health Choice, Veterans' Benefits)  Household income should be no more than 200% of the poverty level The clinic cannot treat you if you are pregnant or think you are pregnant  Sexually transmitted diseases are not treated at the clinic.    Dental Care: Organization         Address  Phone  Notes  [facility] Department of [facility]patient [facility] [address] [place], [place] [phone] Accepts children up to age 53 who are enrolled in [place] or [place] Health Choice; pregnant women with a Medicaid card; and children who have applied for Medicaid or [place] Health Choice, but were declined, whose parents can pay a reduced fee at time of service.  [facility] of [facility]  [address], [place] [phone] Accepts children up to age 56 who are enrolled in [place] or [place] Health Choice; pregnant women with a Medicaid card; and children who have applied for Medicaid or  Health Choice, but were declined, whose parents can pay a reduced fee at time of service.  [facility]  [address] [place], [place] [phone] Patients are seen by appointment only. Walk-ins are not accepted. [facility] will see patients 12 years of age and older. Monday - Tuesday (8am-5pm) Most Wednesdays (8:30-5pm) $30 per visit, cash only  [facility] Adult Dental Access PROGRAM  [address], [facility] [phone] Patients are seen by appointment only. Walk-ins are not accepted. [facility]  will see patients 54 years of age and older. One Wednesday Evening (Monthly: Volunteer Based).  $30 per visit, cash only  [employer]  [phone] for adults; Children under age 80, call Graduate Pediatric Dentistry at [phone]. Children aged 87-14, please call [phone] to request a pediatric application.  Dental services are provided in all areas of dental care including fillings, crowns and bridges, complete and partial dentures, implants, gum treatment, root canals, and extractions. Preventive care is also provided. Treatment is provided to both adults and children.  Patients are selected via a lottery and there is often a waiting list.   [facility] [address], [place]  [phone] [email or web address]   [facility] [address] [place], [place] [phone], Ext. [phone] Second and Fourth Thursday of each month, opens at 6:30 AM; Clinic ends at 9 AM.  Patients are seen on a first-come first-served basis, and a limited number are seen during each clinic.   [facility]  [address] Ether Griffins [place], [place] [phone]   Eligibility Requirements You must have lived in [place], [place], or [place] counties for at least the last three months.   You cannot be eligible for state or federal sponsored [place], including [employer], [place], or [employer].   You generally cannot be eligible for healthcare insurance through your employer.    How to apply: Eligibility screenings are held every Tuesday and Wednesday afternoon from 1:00 pm until 4:00 pm. You do not need an appointment for the interview!  [facility] [address] [place], [place] [phone]   [facility] Health Department  [phone]   [facility] Health Department  [phone]   [facility] Health Department  [phone]    Behavioral Health Resources in the Community: Intensive Outpatient  Programs Organization         Address  Phone  Notes  [facility] Services [address] [place], [place] [phone]   [facility] Outpatient [address] [place], [place] [phone]   [facility] [address], [place], [place]  [phone]   [facility] [address]  [place], [place] [phone] or [phone]   Substance Abuse Resources Organization         Address  Phone  Notes  [facility]  [phone]   [facility]  [phone]   [facility]  [phone]   Floydene Flock  [phone]   Residential & Outpatient Substance Abuse Program  [phone]   Psychological Services Organization         Address  Phone  Notes  [facility]  [phone]   [facility]  [phone]   [facility] [address] [place] [phone] or [phone]    Mobile Crisis Teams Organization         Address  Phone  Notes  [facility], Mobile Crisis Care Unit  [phone]   [facility]  [address] [place], [place] [phone]   Doristine Locks [address] [place] [phone]    Self-Help/Support Groups Organization         Address  Phone             Notes  [facility] [place] - variety of support groups  [phone] Call for more information  Narcotics Anonymous (NA), [facility] [address] [place] [address], [employer] [place]  2 meetings at this location   Statistician         Address  Phone  Notes  [facility] [address],    [place]  [phone]   [facility]  [address] [place] [address], [place], [place] [phone]   [facility] [address] [place], [place] [phone] Admissions: 8am-3pm M-F  [facility] [address]    [place], [place]  [phone]   [facility]  [address] Leonard Schwartz [place], [place] [phone]   [facility] [address],  [place], [place] [phone]   [facility] - Intensive Outpatient [address] Laurell Josephs [address], [place], [place] [phone]   [facility] ([facility] [address] [place].,  [place], [place] [phone] or [phone]   [facility] ([facility]) [address], [place], [place] [phone] Accepts Medicaid  [facility] [address],  [place] [phone] Substance Abuse/Addiction Treatment   [facility] Organization         Address  Phone  Notes  [facility]  [phone]   Angie Fava, PhD [address] Ervin Knack [place], [place]   [phone] or [phone]   [facility] Behavioral   [address] [place], [place] [phone]   [facility] [address] [place] [place], [place] [phone] Insurance/Medicaid/sponsorship through [facility] [address]                                    [place], [place] [phone] Therapy/tele-psych/case  [facility] [address], [place] [phone]    Dr. Lolly Mustache  [phone]   [facility]  [employer] [facility] Dept. 1) [address] [place] 2) [address] [place] 3)  [address], [place] [phone]  [phone]   [facility] Child Abuse Hotline [phone] or [phone] (After Hours)

## 2014-07-08 NOTE — ED Notes (Signed)
240 cc of water given  

## 2014-07-08 NOTE — ED Provider Notes (Signed)
CSN: [id number]     Arrival date & time 07/08/14  4098 History   First MD Initiated Contact with Patient 07/08/14 [phone]     Chief Complaint  Patient presents with  . Generalized Body Aches     (Consider location/radiation/quality/duration/timing/severity/associated sxs/prior Treatment) The history is provided by the patient.   Vicki Everett is a 24 y.o. female presents for evaluation of weakness, myalgias, nausea, present for 3 days. She denies diarrhea. Last menstrual period was 2 months ago. She has been pregnant 5 times with 2 living children. She denies chest pain, shortness of breath, cough, or back pain. There are no other known modifying factors.   Past Medical History  Diagnosis Date  . Asthma   . Vaginal Pap smear, abnormal   . Sciatica   . H/O colposcopy with cervical biopsy 04/2013   Past Surgical History  Procedure Laterality Date  . Knee surgery  2011 & 2012   Family History  Problem Relation Age of Onset  . Alcohol abuse Neg Hx   . Arthritis Neg Hx   . Asthma Neg Hx   . Birth defects Neg Hx   . Cancer Neg Hx   . COPD Neg Hx   . Depression Neg Hx   . Diabetes Neg Hx   . Drug abuse Neg Hx   . Early death Neg Hx   . Hearing loss Neg Hx   . Hyperlipidemia Neg Hx   . Heart disease Neg Hx   . Hypertension Neg Hx   . Kidney disease Neg Hx   . Learning disabilities Neg Hx   . Mental illness Neg Hx   . Mental retardation Neg Hx   . Miscarriages / Stillbirths Neg Hx   . Stroke Neg Hx   . Vision loss Neg Hx   . Varicose Veins Neg Hx    History  Substance Use Topics  . Smoking status: Current Every Day Smoker -- 0.50 packs/day    Types: Cigarettes  . Smokeless tobacco: Never Used  . Alcohol Use: Yes   OB History    Gravida Para Term Preterm AB TAB SAB Ectopic Multiple Living   Review of Systems  All other systems reviewed and are negative.     Allergies  Sulfa antibiotics and Sodium chloride  Home Medications   Prior to  Admission medications   Medication Sig Start Date End Date Taking? Authorizing Provider  acetaminophen (TYLENOL) 500 MG tablet Take 1,000 mg by mouth every 6 (six) hours as needed for mild pain.    Yes Historical Provider, MD  albuterol (PROVENTIL HFA;VENTOLIN HFA) 108 (90 BASE) MCG/ACT inhaler Inhale 2 puffs into the lungs 2 (two) times daily as needed for wheezing or shortness of breath.   Yes Historical Provider, MD  budesonide-formoterol (SYMBICORT) 80-4.5 MCG/ACT inhaler Inhale 2 puffs into the lungs 2 (two) times daily.   Yes Historical Provider, MD  promethazine (PHENERGAN) 25 MG tablet Take 25 mg by mouth every 6 (six) hours as needed for nausea or vomiting.   Yes Historical Provider, MD  Budesonide-Formoterol Fumarate (SYMBICORT IN) Inhale 2 puffs into the lungs daily.    Historical Provider, MD  famotidine (PEPCID) 40 MG tablet Take 1 tablet (40 mg total) by mouth daily. Patient not taking: Reported on 07/08/2014 05/11/13   Adam Phenix, MD  ferrous sulfate 325 (65 FE) MG tablet Take 325 mg by mouth daily with supper.  Historical Provider, MD  ibuprofen (ADVIL,MOTRIN) 800 MG tablet Take 1 tablet (800 mg total) by mouth 3 (three) times daily. Patient not taking: Reported on 07/08/2014 04/07/14   Toni Amendourtney Forcucci, PA-C  lidocaine (XYLOCAINE) 2 % solution Use as directed 20 mLs in the mouth or throat as needed for mouth pain. Patient not taking: Reported on 07/08/2014 04/07/14   Courtney Forcucci, PA-C   BP 109/66 mmHg  Pulse 87  Temp(Src) 98.4 F (36.9 C) (Oral)  Resp 16  Ht 5\' 5"  (1.651 m)  Wt 175 lb (79.379 kg)  BMI 29.12 kg/m2  SpO2 100%  LMP 04/24/2014 Physical Exam  Constitutional: She is oriented to person, place, and time. She appears well-developed and well-nourished.  HENT:  Head: Normocephalic and atraumatic.  Right Ear: External ear normal.  Left Ear: External ear normal.  Eyes: Conjunctivae and EOM are normal. Pupils are equal, round, and reactive to light.  Neck:  Normal range of motion and phonation normal. Neck supple.  Cardiovascular: Normal rate, regular rhythm and normal heart sounds.   Pulmonary/Chest: Effort normal and breath sounds normal. She exhibits no bony tenderness.  Abdominal: Soft. She exhibits no distension and no mass. There is no tenderness. There is no rebound and no guarding.  Musculoskeletal: Normal range of motion.  Neurological: She is alert and oriented to person, place, and time. No cranial nerve deficit or sensory deficit. She exhibits normal muscle tone. Coordination normal.  Skin: Skin is warm, dry and intact.  Psychiatric: She has a normal mood and affect. Her behavior is normal. Judgment and thought content normal.  Nursing note and vitals reviewed.   ED Course  Procedures (including critical care time)  Medications  ondansetron (ZOFRAN-ODT) disintegrating tablet 8 mg (8 mg Oral Given 07/08/14 0553)  ibuprofen (ADVIL,MOTRIN) tablet 400 mg (400 mg Oral Given 07/08/14 0553)    Patient Vitals for the past 24 hrs:  BP Temp Temp src Pulse Resp SpO2 Height Weight  07/08/14 0546 109/66 mmHg 98.4 F (36.9 C) Oral 87 16 100 % 5\' 5"  (1.651 m) 175 lb (79.379 kg)    6:41 AM Reevaluation with update and discussion. After initial assessment and treatment, an updated evaluation reveals she is tolerating oral liquids now. Findings discussed with patient, all questions answered. Kalese Ensz L    Labs Review Labs Reviewed  CBC WITH DIFFERENTIAL/PLATELET - Abnormal; Notable for the following:    Hemoglobin 11.9 (*)    HCT 33.8 (*)    All other components within normal limits  COMPREHENSIVE METABOLIC PANEL  LIPASE, BLOOD  URINALYSIS, ROUTINE W REFLEX MICROSCOPIC  POC URINE PREG, ED    Imaging Review No results found.   EKG Interpretation None      MDM   Final diagnoses:  Pregnancy  Morning sickness    Uncomplicated first trimester pregnancy, with morning sickness. No indication for further evaluation or  treatment in the emergency department setting.   Nursing Notes Reviewed/ Care Coordinated Applicable Imaging Reviewed Interpretation of Laboratory Data incorporated into ED treatment  The patient appears reasonably screened and/or stabilized for discharge and I doubt any other medical condition or other [facility] requiring further screening, evaluation, or treatment in the ED at this time prior to discharge.  Plan: Home Medications- Zofran; Home Treatments- rest; return here if the recommended treatment, does not improve the symptoms; Recommended follow up- PCP prn     Mancel BaleElliott Reese Stockman, MD 07/08/14 [phone]

## 2014-07-08 NOTE — ED Notes (Signed)
Pt. Left with all belongings and refused wheelchair 

## 2014-07-08 NOTE — ED Notes (Signed)
Pt. Has been feeling weak and tired for 3 days now. Pt. Has had constant nausea and cannot keep anything down. Pt. Also stays she has had chills.

## 2016-02-16 IMAGING — CR DG HIP (WITH OR WITHOUT PELVIS) 2-3V*L*
2 series · 2 of 2 positions shown · non-contrast
Comparison: None.

CLINICAL DATA: Left hip pain secondary to a fall today.

EXAM:
LEFT HIP - COMPLETE 2+ VIEW

[t hip ap left]
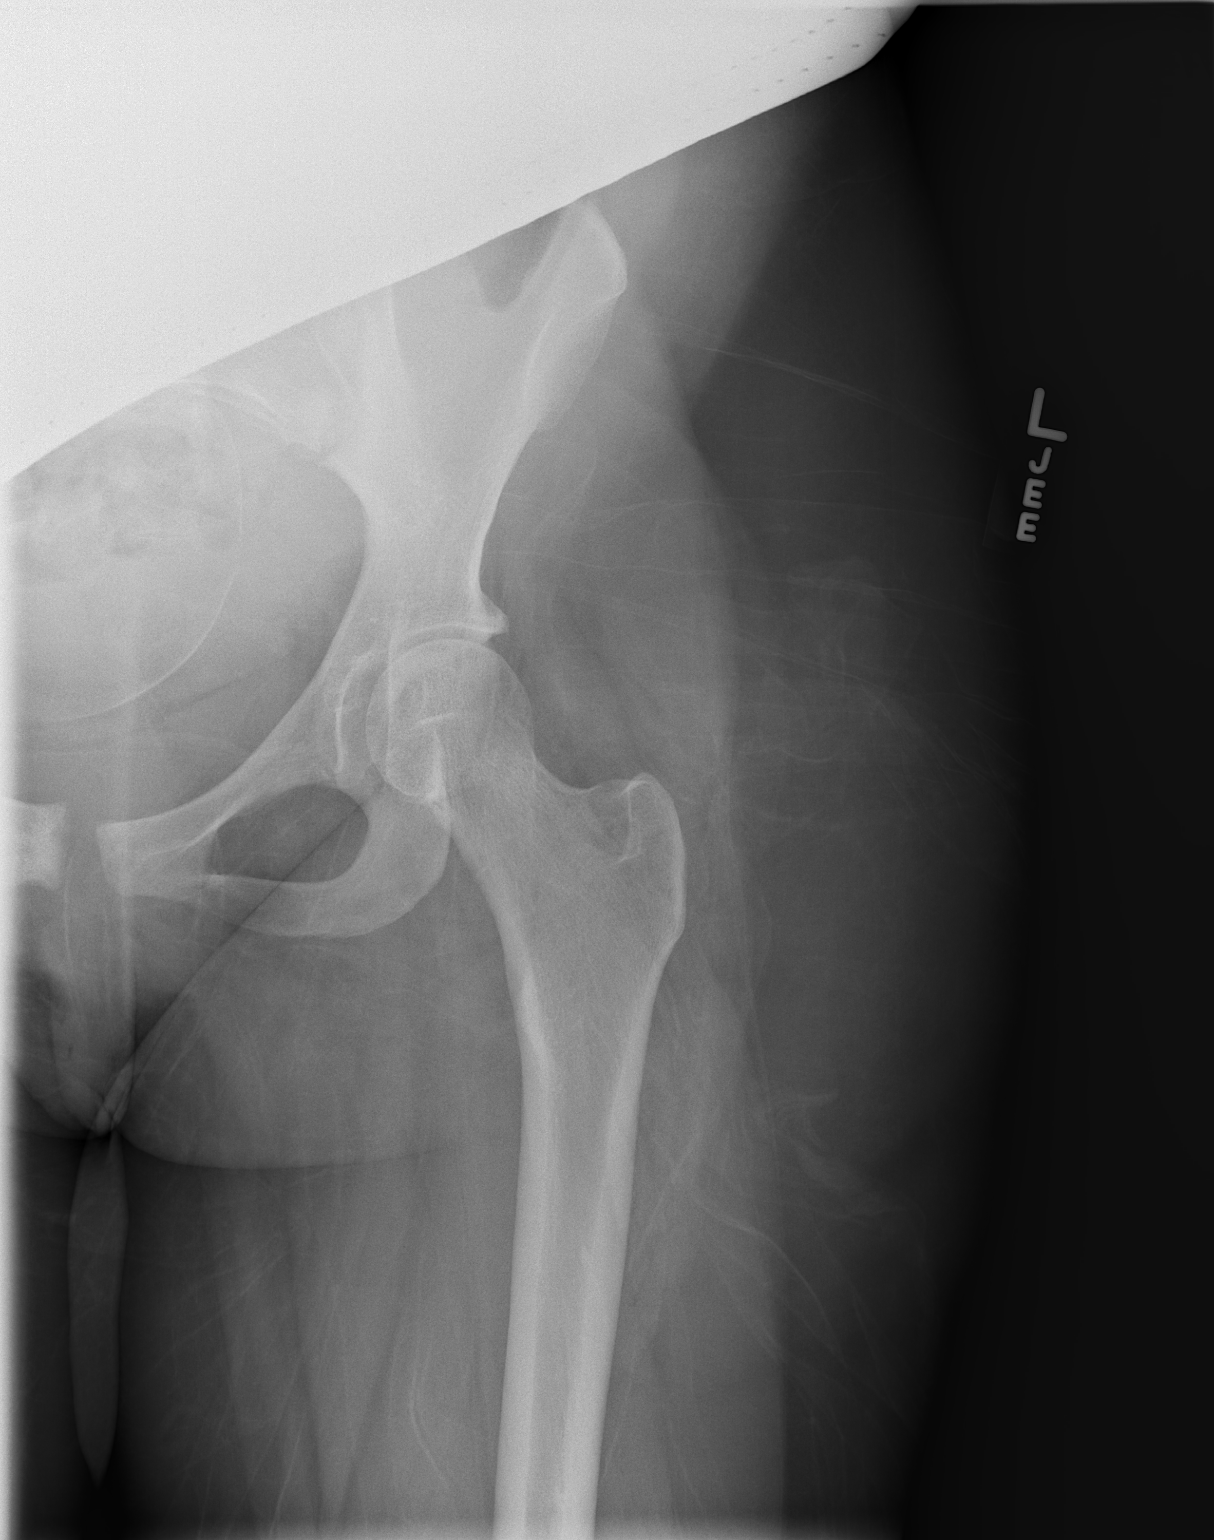

[t hip frog leg left]
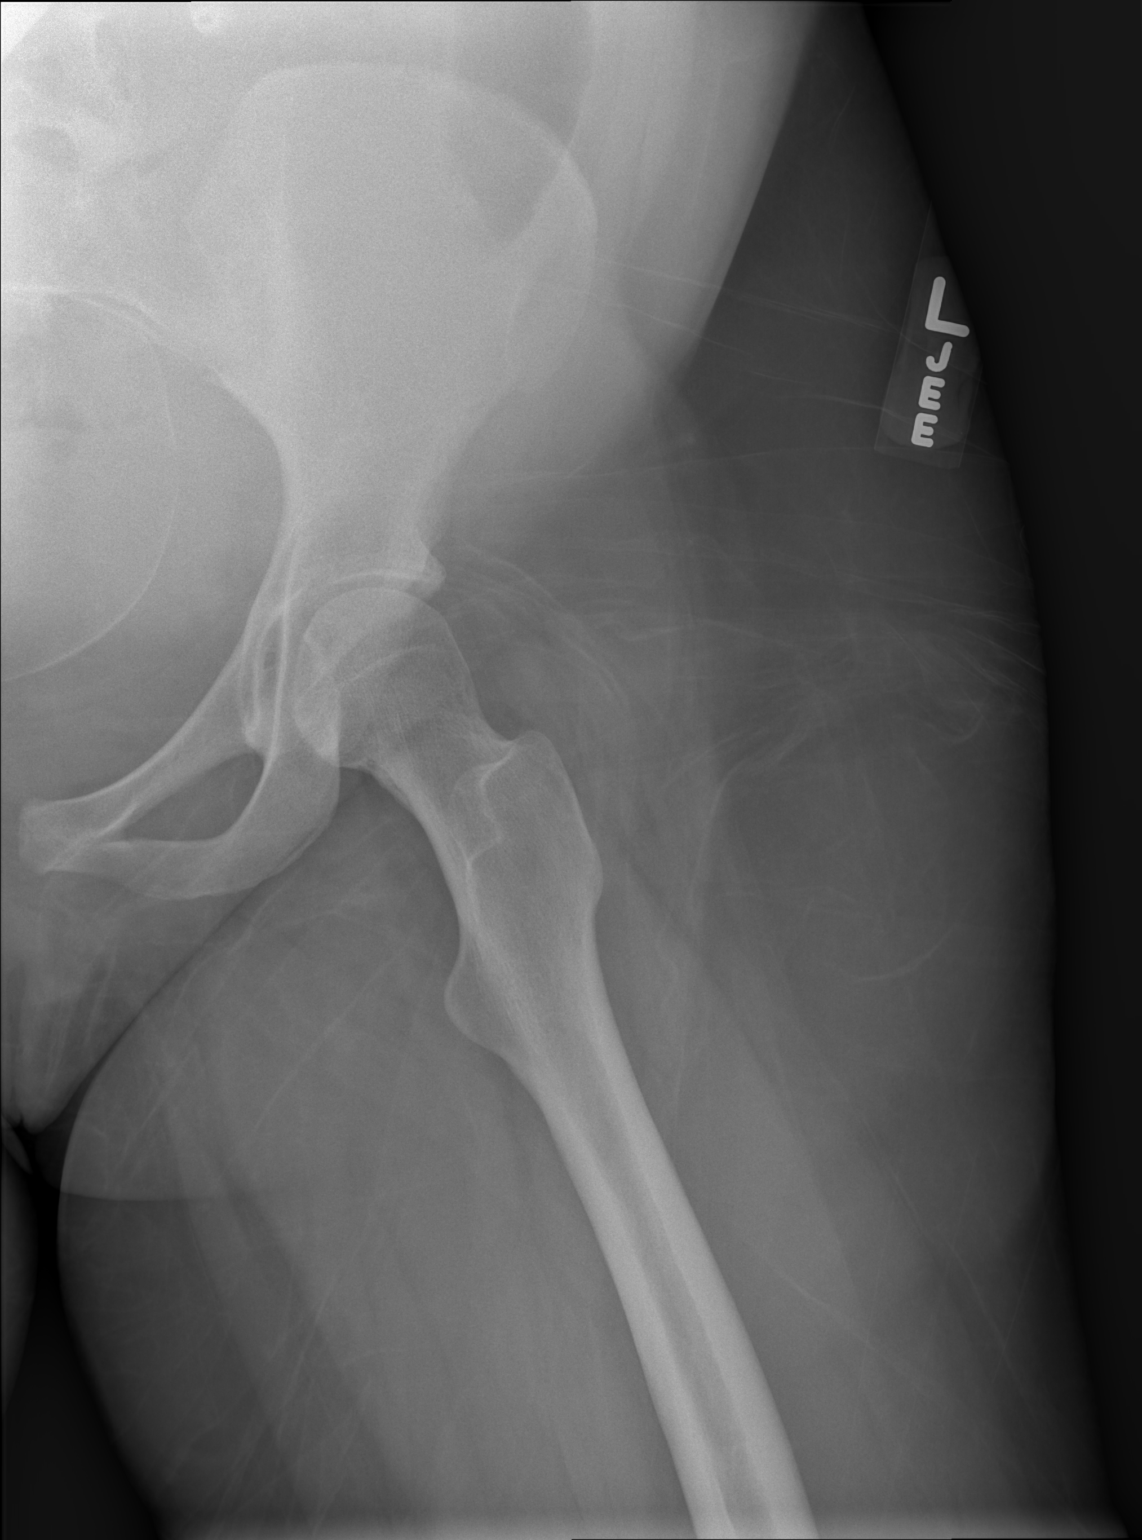

[2 of 2 positions shown; findings below may reference images not displayed]

FINDINGS: Osseous structures of the hip are normal.
IMPRESSION: Normal exam.
